# Patient Record
Sex: Male | Born: 1970
Health system: Southern US, Community
[De-identification: ages and names within clinical notes are randomized; demographics above are authoritative.]

## PROBLEM LIST (undated history)

## (undated) DIAGNOSIS — F909 Attention-deficit hyperactivity disorder, unspecified type: Secondary | ICD-10-CM

---

## 2018-03-01 ENCOUNTER — Ambulatory Visit (INDEPENDENT_AMBULATORY_CARE_PROVIDER_SITE_OTHER): Payer: Self-pay | Admitting: Pharmacist

## 2018-03-01 DIAGNOSIS — Z7252 High risk homosexual behavior: Secondary | ICD-10-CM

## 2018-03-01 NOTE — Progress Notes (Signed)
Date:  03/01/2018   HPI: Nedra HaiJeremy Demonbreun is a 47 y.o. male who presents to the [facility] clinic to discuss and initiate PrEP.  Insured   []    Uninsured  [x]    There are no active problems to display for this patient.   Patient's Medications  New Prescriptions   No medications on file  Previous Medications   LISDEXAMFETAMINE (VYVANSE) 70 MG CAPSULE    Take 70 mg by mouth daily.   MULTIPLE VITAMIN (MULTIVITAMIN) TABLET    Take 1 tablet by mouth daily.   PENTOXIFYLLINE (TRENTAL) 400 MG CR TABLET    Take 400 mg by mouth 3 (three) times daily with meals.  Modified Medications   No medications on file  Discontinued Medications   No medications on file    Allergies: Allergies  Allergen Reactions  . Penicillins Hives    As an infant, does not remember reaction specifically    Past Medical History: No past medical history on file.  Social History: Social History   Socioeconomic History  . Marital status: Single    Spouse name: Not on file  . Number of children: Not on file  . Years of education: Not on file  . Highest education level: Not on file  Occupational History  . Not on file  Social Needs  . Financial resource strain: Not on file  . Food insecurity:    Worry: Not on file    Inability: Not on file  . Transportation needs:    Medical: Not on file    Non-medical: Not on file  Tobacco Use  . Smoking status: Not on file  Substance and Sexual Activity  . Alcohol use: Not on file  . Drug use: Not on file  . Sexual activity: Not on file  Lifestyle  . Physical activity:    Days per week: Not on file    Minutes per session: Not on file  . Stress: Not on file  Relationships  . Social connections:    Talks on phone: Not on file    Gets together: Not on file    Attends religious service: Not on file    Active member of club or organization: Not on file    Attends meetings of clubs or organizations: Not on file    Relationship status: Not on file  Other Topics  Concern  . Not on file  Social History Narrative  . Not on file    [facility]  Insurance Status Uninsured  How did you hear? [facility]  Gender at birth Male  Gender identity cis-Male  Risk for HIV Condomless vaginal or anal intercourse  Sex Partners Men only  # sex partners past 3-6 mos 1-3  Sex activity preferences Insertive and receptive;Oral  Treated for STI? No  HIV symptoms? N/A  PrEP Eligibility Substantial risk for HIV    Labs:  SCr: No results found for: CREATININE HIV No results found for: HIV Hepatitis B No results found for: HEPBSAB, HEPBSAG, HEPBCAB Hepatitis C No results found for: HEPCAB, HCVRNAPCRQN Hepatitis A No results found for: HAV RPR and STI No results found for: LABRPR, RPRTITER  No flowsheet data found.  Assessment: Riki RuskJeremy is here today to initiate and discuss PrEP.  He found out about our program through [facility]. He has actually been on Truvada for over a year.  He states he tolerates it just fine with no side effects and takes it every day. He has a new prescription  from his previous doctor, but does not have any insurance now.  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  He will eventually get insurance through his school but is uninsured for now. He has ~10 tablets left of Truvada in his current bottle.  He has had 3 partners in the last 6 months. He is a versatile partner with oral sex involvement as well. Occasional condom use. He has never been diagnosed with a STD but states he was proactively treated for chlamydia but ended up having prostatitis instead. I will apply to [employer] [facility] [employer] for his Truvada and have him in our uninsured PrEP population. He does not currently have an income while in school and is supported by financial aid and student loans. I will get baseline STD testing, HIV antibody, BMET, and Hep B ab/ag today. I will see him back in 3 months since he has been taking Truvada  for ~1 year.  Plan: - 3 site swab testing, RPR, BMET, Hep B ab/ag, HIV antibody today - Truvada x 3 months if HIV negative - F/u with me 9/30 at 915am  Floria Brandau L. Tasheka Houseman, PharmD, AAHIVP, CPP Infectious Diseases Clinical Pharmacist [facility] 03/01/2018, 4:08 PM

## 2018-03-03 ENCOUNTER — Telehealth: Payer: Self-pay | Admitting: Pharmacist

## 2018-03-03 DIAGNOSIS — Z7252 High risk homosexual behavior: Secondary | ICD-10-CM

## 2018-03-03 LAB — BASIC METABOLIC PANEL
BUN: 17 mg/dL (ref 7–25)
CO2: 30 mmol/L (ref 20–32)
CREATININE: 1.16 mg/dL (ref 0.60–1.35)
Calcium: 9.6 mg/dL (ref 8.6–10.3)
Chloride: 103 mmol/L (ref 98–110)
Glucose, Bld: 92 mg/dL (ref 65–99)
Potassium: 4.5 mmol/L (ref 3.5–5.3)
Sodium: 139 mmol/L (ref 135–146)

## 2018-03-03 LAB — HEPATITIS B SURFACE ANTIGEN: Hepatitis B Surface Ag: NONREACTIVE

## 2018-03-03 LAB — CYTOLOGY, (ORAL, ANAL, URETHRAL) ANCILLARY ONLY
CHLAMYDIA, DNA PROBE: NEGATIVE
CHLAMYDIA, DNA PROBE: NEGATIVE
NEISSERIA GONORRHEA: NEGATIVE
Neisseria Gonorrhea: NEGATIVE

## 2018-03-03 LAB — HEPATITIS B SURFACE ANTIBODY,QUALITATIVE: HEP B S AB: REACTIVE — AB

## 2018-03-03 LAB — URINE CYTOLOGY ANCILLARY ONLY
Chlamydia: NEGATIVE
Neisseria Gonorrhea: NEGATIVE

## 2018-03-03 LAB — RPR: RPR Ser Ql: NONREACTIVE

## 2018-03-03 LAB — HIV ANTIBODY (ROUTINE TESTING W REFLEX): HIV: NONREACTIVE

## 2018-03-03 MED ORDER — EMTRICITABINE-TENOFOVIR DF 200-300 MG PO TABS: 1.0000 | ORAL_TABLET | Freq: Every day | ORAL | 2 refills | Status: DC

## 2018-03-03 MED FILL — TRUVADA 200-300 MG TABS: 200-300 | 30 days supply | Qty: 30 | Fill #0

## 2018-03-03 NOTE — Telephone Encounter (Signed)
Patient has been approved for uninsured PrEP through [employer] patient assistance program until 03/03/19.   Called patient to let him know that his HIV antibody was negative.  Will send in 3 months of Truvada for him and [facility]

## 2018-03-03 NOTE — Telephone Encounter (Signed)
Perfect

## 2018-04-05 MED FILL — TRUVADA 200-300 MG TABS: 200-300 | 30 days supply | Qty: 30 | Fill #1

## 2018-05-06 MED FILL — TRUVADA 200-300 MG TABS: 200-300 | 30 days supply | Qty: 30 | Fill #2

## 2018-05-31 ENCOUNTER — Ambulatory Visit (INDEPENDENT_AMBULATORY_CARE_PROVIDER_SITE_OTHER): Payer: BLUE CROSS/BLUE SHIELD | Admitting: Pharmacist

## 2018-05-31 DIAGNOSIS — Z7252 High risk homosexual behavior: Secondary | ICD-10-CM | POA: Diagnosis not present

## 2018-05-31 NOTE — Progress Notes (Signed)
Date:  05/31/2018   HPI: Glenn Sanchez is a 47 y.o. male who presents to the [facility] clinic for his 3 month PrEP follow-up visit.  Insured   [x]    Uninsured  []    There are no active problems to display for this patient.   Patient's Medications  New Prescriptions   No medications on file  Previous Medications   EMTRICITABINE-TENOFOVIR (TRUVADA) 200-300 MG TABLET    Take 1 tablet by mouth daily.   LISDEXAMFETAMINE (VYVANSE) 70 MG CAPSULE    Take 70 mg by mouth daily.   MULTIPLE VITAMIN (MULTIVITAMIN) TABLET    Take 1 tablet by mouth daily.   PENTOXIFYLLINE (TRENTAL) 400 MG CR TABLET    Take 400 mg by mouth 3 (three) times daily with meals.  Modified Medications   No medications on file  Discontinued Medications   No medications on file    Allergies: Allergies  Allergen Reactions  . Penicillins Hives    As an infant, does not remember reaction specifically    Past Medical History: No past medical history on file.  Social History: Social History   Socioeconomic History  . Marital status: Single    Spouse name: Not on file  . Number of children: Not on file  . Years of education: Not on file  . Highest education level: Not on file  Occupational History  . Not on file  Social Needs  . Financial resource strain: Not on file  . Food insecurity:    Worry: Not on file    Inability: Not on file  . Transportation needs:    Medical: Not on file    Non-medical: Not on file  Tobacco Use  . Smoking status: Not on file  Substance and Sexual Activity  . Alcohol use: Not on file  . Drug use: Not on file  . Sexual activity: Not on file  Lifestyle  . Physical activity:    Days per week: Not on file    Minutes per session: Not on file  . Stress: Not on file  Relationships  . Social connections:    Talks on phone: Not on file    Gets together: Not on file    Attends religious service: Not on file    Active member of club or organization: Not on file    Attends  meetings of clubs or organizations: Not on file    Relationship status: Not on file  Other Topics Concern  . Not on file  Social History Narrative  . Not on file    [facility] HIV PREP FLOWSHEET RESULTS 05/31/2018 03/01/2018  Insurance Status Insured Uninsured  How did you hear? Kelby Fam  Gender at birth Male Male  Gender identity cis-Male cis-Male  Risk for HIV Condomless vaginal or anal intercourse Condomless vaginal or anal intercourse  Sex Partners Men only Men only  # sex partners past 3-6 mos 1-3 1-3  Sex activity preferences Insertive;Receptive;Oral Insertive and receptive;Oral  Condom use Yes -  % condom use 100 -  Treated for STI? No No  HIV symptoms? N/A N/A  PrEP Eligibility HIV negative;CrCl >60 ml/min Substantial risk for HIV    Labs:  SCr: Lab Results  Component Value Date   CREATININE 1.16 03/01/2018   HIV Lab Results  Component Value Date   HIV NON-REACTIVE 03/01/2018   Hepatitis B Lab Results  Component Value Date   HEPBSAB REACTIVE (A) 03/01/2018   HEPBSAG NON-REACTIVE 03/01/2018   Hepatitis C No results  found for: HEPCAB, HCVRNAPCRQN Hepatitis A No results found for: HAV RPR and STI Lab Results  Component Value Date   LABRPR NON-REACTIVE 03/01/2018    STI Results GC CT  03/01/2018 Negative Negative  03/01/2018 Negative Negative  03/01/2018 Negative Negative    Assessment: Overall Glenn Sanchez is doing well and keeping busy with nursing school. He reports no side effects on Truvada and no missed doses. He reports having about 2 weeks left of Truvada and hasn't had any issues getting it from [facility]. He has insurance now through school and was a little concerned about paying for his Truvada. We reassured him that for any copay that he may have, we can use a copay card to cover it.   He reports having 2 new partners since we last saw him and uses condoms 100% of the time. We will check his HIV antibody today, but he is not due for a BMET or STD screenings. He says  that he has been telling a lot of other people about our PrEP program and we encouraged him to continue doing so.  Glenn Sanchez got his flu shot last week. When asked about his Hepatitis A immunity, he reports having titers which he had to submit for school. We will defer checking his HAV antibody since he has records of immunity.  Plan: -HIV antibody today -3 more months of Truvada if HIV negative -F/u for PrEP on 09/02/18 at 9:15AM  Arvilla Market, PharmD PGY1 Pharmacy Resident Phone [phone] 05/31/2018     10:19 AM

## 2018-06-01 ENCOUNTER — Telehealth: Payer: Self-pay | Admitting: Pharmacist

## 2018-06-01 DIAGNOSIS — Z7252 High risk homosexual behavior: Secondary | ICD-10-CM

## 2018-06-01 LAB — HIV ANTIBODY (ROUTINE TESTING W REFLEX): HIV: NONREACTIVE

## 2018-06-01 MED ORDER — EMTRICITABINE-TENOFOVIR DF 200-300 MG PO TABS: 1.0000 | ORAL_TABLET | Freq: Every day | ORAL | 2 refills | Status: DC

## 2018-06-01 NOTE — Telephone Encounter (Signed)
Called patient to let him know that his HIV antibody was negative.  Will send in 3 more months of Truvada.  His number states it is out of service, will try and get a new one for him.

## 2018-06-03 MED FILL — TRUVADA 200-300 MG TABS: 200-300 | 30 days supply | Qty: 30 | Fill #0

## 2018-07-05 MED FILL — TRUVADA 200-300 MG TABS: 200-300 | 30 days supply | Qty: 30 | Fill #1

## 2018-08-04 MED FILL — TRUVADA 200-300 MG TABS: 200-300 | 30 days supply | Qty: 30 | Fill #2

## 2018-09-02 ENCOUNTER — Ambulatory Visit (INDEPENDENT_AMBULATORY_CARE_PROVIDER_SITE_OTHER): Payer: BLUE CROSS/BLUE SHIELD | Admitting: Pharmacist

## 2018-09-02 ENCOUNTER — Other Ambulatory Visit (HOSPITAL_COMMUNITY)
Admission: RE | Admit: 2018-09-02 | Discharge: 2018-09-02 | Disposition: A | Discharge status: Home / Self Care | Payer: BLUE CROSS/BLUE SHIELD | Source: Ambulatory Visit | Attending: Infectious Disease | Admitting: Infectious Disease

## 2018-09-02 DIAGNOSIS — Z7252 High risk homosexual behavior: Secondary | ICD-10-CM | POA: Insufficient documentation

## 2018-09-02 NOTE — Progress Notes (Signed)
Date:  09/02/2018   HPI: Glenn Sanchez is a 48 y.o. male who presents to the [facility] clinic for his 3 month PrEP follow-up.   Insured   [x]    Uninsured  []    There are no active problems to display for this patient.   Patient's Medications  New Prescriptions   No medications on file  Previous Medications   EMTRICITABINE-TENOFOVIR (TRUVADA) 200-300 MG TABLET    Take 1 tablet by mouth daily.   LISDEXAMFETAMINE (VYVANSE) 70 MG CAPSULE    Take 70 mg by mouth daily.   MULTIPLE VITAMIN (MULTIVITAMIN) TABLET    Take 1 tablet by mouth daily.   PENTOXIFYLLINE (TRENTAL) 400 MG CR TABLET    Take 400 mg by mouth 3 (three) times daily with meals.  Modified Medications   No medications on file  Discontinued Medications   No medications on file    Allergies: Allergies  Allergen Reactions  . Penicillins Hives    As an infant, does not remember reaction specifically    Past Medical History: No past medical history on file.  Social History: Social History   Socioeconomic History  . Marital status: Single    Spouse name: Not on file  . Number of children: Not on file  . Years of education: Not on file  . Highest education level: Not on file  Occupational History  . Not on file  Social Needs  . Financial resource strain: Not on file  . Food insecurity:    Worry: Not on file    Inability: Not on file  . Transportation needs:    Medical: Not on file    Non-medical: Not on file  Tobacco Use  . Smoking status: Not on file  Substance and Sexual Activity  . Alcohol use: Not on file  . Drug use: Not on file  . Sexual activity: Not on file  Lifestyle  . Physical activity:    Days per week: Not on file    Minutes per session: Not on file  . Stress: Not on file  Relationships  . Social connections:    Talks on phone: Not on file    Gets together: Not on file    Attends religious service: Not on file    Active member of club or organization: Not on file    Attends meetings  of clubs or organizations: Not on file    Relationship status: Not on file  Other Topics Concern  . Not on file  Social History Narrative  . Not on file    [facility] HIV PREP FLOWSHEET RESULTS 09/02/2018 05/31/2018 03/01/2018  Insurance Status Insured Insured Uninsured  How did you hear? - - Kelby Fam  Gender at birth Male Male Male  Gender identity cis-Male cis-Male cis-Male  Risk for HIV >5 partners in past 6 mos (regardless of condom use) Condomless vaginal or anal intercourse Condomless vaginal or anal intercourse  Sex Partners Men only Men only Men only  # sex partners past 3-6 mos 1-3 1-3 1-3  Sex activity preferences Oral;Insertive and receptive Insertive;Receptive;Oral Insertive and receptive;Oral  Condom use Yes Yes -  % condom use 100 100 -  Treated for STI? No No No  HIV symptoms? N/A N/A N/A  PrEP Eligibility Substantial risk for HIV HIV negative;CrCl >60 ml/min Substantial risk for HIV    Labs:  SCr: Lab Results  Component Value Date   CREATININE 1.03 09/02/2018   CREATININE 1.16 03/01/2018   HIV Lab Results  Component  Value Date   HIV NON-REACTIVE 05/31/2018   HIV NON-REACTIVE 03/01/2018   Hepatitis B Lab Results  Component Value Date   HEPBSAB REACTIVE (A) 03/01/2018   HEPBSAG NON-REACTIVE 03/01/2018   Hepatitis C No results found for: HEPCAB, HCVRNAPCRQN Hepatitis A No results found for: HAV RPR and STI Lab Results  Component Value Date   LABRPR NON-REACTIVE 03/01/2018    STI Results GC CT  03/01/2018 Negative Negative  03/01/2018 Negative Negative  03/01/2018 Negative Negative    Assessment: Usamah is doing well on Truvada with no missed doses and no side effects.  He does not have any insurance changes with the new year and no issues with the pharmacy.  He continues in [employer] and is doing well.  He has 3-4 doses left of his Truvada.   He has had 2 partners in the last 3 months with 100% condom use. He is due for STD testing and is a versatile  partner with oral involvement as well, so will do all three site swabs today.  Will also check a HIV antibody and send in refills if negative.  I discussed switching to Descovy today and he is agreeable.  Will send in with next prescription.   Plan: - HIV antibody, urine/oral/rectal swabs, RPR, BMET today - Descovy x 3 months if negative - F/u with me 3/26 at 915am  Hiawatha Dressel L. Camyra Vaeth, PharmD, BCIDP, AAHIVP, CPP Infectious Diseases Clinical Pharmacist [facility] 09/02/2018, 4:21 PM

## 2018-09-03 ENCOUNTER — Other Ambulatory Visit: Payer: Self-pay | Admitting: *Deleted

## 2018-09-03 ENCOUNTER — Other Ambulatory Visit (HOSPITAL_COMMUNITY)
Admission: RE | Admit: 2018-09-03 | Discharge: 2018-09-03 | Disposition: A | Discharge status: Home / Self Care | Payer: BLUE CROSS/BLUE SHIELD | Source: Ambulatory Visit | Attending: Infectious Disease | Admitting: Infectious Disease

## 2018-09-03 ENCOUNTER — Other Ambulatory Visit: Payer: BLUE CROSS/BLUE SHIELD

## 2018-09-03 ENCOUNTER — Telehealth: Payer: Self-pay | Admitting: Pharmacist

## 2018-09-03 DIAGNOSIS — Z7252 High risk homosexual behavior: Secondary | ICD-10-CM

## 2018-09-03 DIAGNOSIS — Z113 Encounter for screening for infections with a predominantly sexual mode of transmission: Secondary | ICD-10-CM

## 2018-09-03 LAB — BASIC METABOLIC PANEL
BUN: 15 mg/dL (ref 7–25)
CALCIUM: 10.2 mg/dL (ref 8.6–10.3)
CO2: 28 mmol/L (ref 20–32)
Chloride: 102 mmol/L (ref 98–110)
Creat: 1.03 mg/dL (ref 0.60–1.35)
Glucose, Bld: 90 mg/dL (ref 65–99)
Potassium: 4.5 mmol/L (ref 3.5–5.3)
Sodium: 138 mmol/L (ref 135–146)

## 2018-09-03 LAB — HIV ANTIBODY (ROUTINE TESTING W REFLEX): HIV 1&2 Ab, 4th Generation: NONREACTIVE

## 2018-09-03 LAB — CYTOLOGY, (ORAL, ANAL, URETHRAL) ANCILLARY ONLY
Chlamydia: NEGATIVE
Chlamydia: NEGATIVE
Neisseria Gonorrhea: NEGATIVE
Neisseria Gonorrhea: NEGATIVE

## 2018-09-03 LAB — RPR: RPR: NONREACTIVE

## 2018-09-03 MED ORDER — EMTRICITABINE-TENOFOVIR AF 200-25 MG PO TABS: 1.0000 | ORAL_TABLET | Freq: Every day | ORAL | 2 refills | Status: DC

## 2018-09-03 MED FILL — DESCOVY 200-25 MG TABS: 200-25 | 30 days supply | Qty: 30 | Fill #0

## 2018-09-03 NOTE — Telephone Encounter (Signed)
Called patient to let him know that his HIV antibody was negative.  Will send in 3 months of Descovy.  

## 2018-09-06 LAB — URINE CYTOLOGY ANCILLARY ONLY
Chlamydia: NEGATIVE
NEISSERIA GONORRHEA: NEGATIVE

## 2018-10-04 MED FILL — DESCOVY 200-25 MG TABS: 200-25 | 30 days supply | Qty: 30 | Fill #1

## 2018-11-02 MED FILL — DESCOVY 200-25 MG TABS: 200-25 | 30 days supply | Qty: 30 | Fill #2

## 2018-11-24 ENCOUNTER — Other Ambulatory Visit: Payer: Self-pay

## 2018-11-24 ENCOUNTER — Ambulatory Visit (INDEPENDENT_AMBULATORY_CARE_PROVIDER_SITE_OTHER): Payer: BLUE CROSS/BLUE SHIELD | Admitting: Pharmacist

## 2018-11-24 DIAGNOSIS — Z7252 High risk homosexual behavior: Secondary | ICD-10-CM | POA: Diagnosis not present

## 2018-11-24 NOTE — Progress Notes (Signed)
Date:  11/24/2018   HPI: Glenn Sanchez is a 48 y.o. male who presents to the [facility] clinic for 3 month PrEP follow-up.  Insured   [x]    Uninsured  []    There are no active problems to display for this patient.   Patient's Medications  New Prescriptions   No medications on file  Previous Medications   EMTRICITABINE-TENOFOVIR AF (DESCOVY) 200-25 MG TABLET    Take 1 tablet by mouth daily.   LISDEXAMFETAMINE (VYVANSE) 70 MG CAPSULE    Take 70 mg by mouth daily.   MULTIPLE VITAMIN (MULTIVITAMIN) TABLET    Take 1 tablet by mouth daily.   PENTOXIFYLLINE (TRENTAL) 400 MG CR TABLET    Take 400 mg by mouth 3 (three) times daily with meals.  Modified Medications   No medications on file  Discontinued Medications   No medications on file    Allergies: Allergies  Allergen Reactions  . Penicillins Hives    As an infant, does not remember reaction specifically    Past Medical History: No past medical history on file.  Social History: Social History   Socioeconomic History  . Marital status: Single    Spouse name: Not on file  . Number of children: Not on file  . Years of education: Not on file  . Highest education level: Not on file  Occupational History  . Not on file  Social Needs  . Financial resource strain: Not on file  . Food insecurity:    Worry: Not on file    Inability: Not on file  . Transportation needs:    Medical: Not on file    Non-medical: Not on file  Tobacco Use  . Smoking status: Not on file  Substance and Sexual Activity  . Alcohol use: Not on file  . Drug use: Not on file  . Sexual activity: Not on file  Lifestyle  . Physical activity:    Days per week: Not on file    Minutes per session: Not on file  . Stress: Not on file  Relationships  . Social connections:    Talks on phone: Not on file    Gets together: Not on file    Attends religious service: Not on file    Active member of club or organization: Not on file    Attends meetings  of clubs or organizations: Not on file    Relationship status: Not on file  Other Topics Concern  . Not on file  Social History Narrative  . Not on file    [facility] HIV PREP FLOWSHEET RESULTS 11/24/2018 09/02/2018 05/31/2018 03/01/2018  Insurance Status Insured Insured Insured Uninsured  How did you hear? - - - Kelby Fam  Gender at birth Male Male Male Male  Gender identity cis-Male cis-Male cis-Male cis-Male  Risk for HIV - >5 partners in past 6 mos (regardless of condom use) Condomless vaginal or anal intercourse Condomless vaginal or anal intercourse  Sex Partners Men only Men only Men only Men only  # sex partners past 3-6 mos 1-3 1-3 1-3 1-3  Sex activity preferences Insertive and receptive;Oral Oral;Insertive and receptive Insertive;Receptive;Oral Insertive and receptive;Oral  Condom use Yes Yes Yes -  % condom use - 100 100 -  Treated for STI? No No No No  HIV symptoms? N/A N/A N/A N/A  PrEP Eligibility Substantial risk for HIV Substantial risk for HIV HIV negative;CrCl >60 ml/min Substantial risk for HIV    Labs:  SCr: Lab Results  Component  Value Date   CREATININE 1.03 09/02/2018   CREATININE 1.16 03/01/2018   HIV Lab Results  Component Value Date   HIV NON-REACTIVE 09/02/2018   HIV NON-REACTIVE 05/31/2018   HIV NON-REACTIVE 03/01/2018   Hepatitis B Lab Results  Component Value Date   HEPBSAB REACTIVE (A) 03/01/2018   HEPBSAG NON-REACTIVE 03/01/2018   Hepatitis C No results found for: HEPCAB, HCVRNAPCRQN Hepatitis A No results found for: HAV RPR and STI Lab Results  Component Value Date   LABRPR NON-REACTIVE 09/02/2018   LABRPR NON-REACTIVE 03/01/2018    STI Results GC CT  09/03/2018 Negative Negative  09/02/2018 Negative Negative  09/02/2018 Negative Negative  03/01/2018 Negative Negative  03/01/2018 Negative Negative  03/01/2018 Negative Negative    Assessment: Glenn Sanchez is here today to follow-up for PrEP.  He was switched to Descovy at his last visit and is doing  well with the switch.  No side effects or missed doses.  No problems with his insurance or [employer]. He has no new partners and remains with the same 2 as before.  We checked kidney function and STDs at last visit so will defer today. Will see him back in 3 months.  Plan: - HIV antibody today - Descovy x 3 months if HIV negative - F/u with me 6/24 at 915am   L. , PharmD, BCIDP, AAHIVP, CPP Infectious Diseases Clinical Pharmacist [facility] 11/24/2018, 3:23 PM

## 2018-11-25 ENCOUNTER — Telehealth: Payer: Self-pay | Admitting: Pharmacist

## 2018-11-25 ENCOUNTER — Ambulatory Visit: Payer: BLUE CROSS/BLUE SHIELD

## 2018-11-25 DIAGNOSIS — Z7252 High risk homosexual behavior: Secondary | ICD-10-CM

## 2018-11-25 LAB — HIV ANTIBODY (ROUTINE TESTING W REFLEX): HIV 1&2 Ab, 4th Generation: NONREACTIVE

## 2018-11-25 MED ORDER — EMTRICITABINE-TENOFOVIR AF 200-25 MG PO TABS: 1.0000 | ORAL_TABLET | Freq: Every day | ORAL | 2 refills | Status: DC

## 2018-11-25 MED FILL — DESCOVY 200-25 MG TABS: 200-25 | 30 days supply | Qty: 30 | Fill #0

## 2018-11-25 NOTE — Telephone Encounter (Signed)
Called patient to let him know that his HIV antibody was negative.  Will send in 3 more months of Descovy and [facility] will mail to his house.

## 2018-12-20 ENCOUNTER — Emergency Department (HOSPITAL_COMMUNITY): Payer: BLUE CROSS/BLUE SHIELD

## 2018-12-20 ENCOUNTER — Emergency Department (HOSPITAL_COMMUNITY)
Admission: EM | Admit: 2018-12-20 | Discharge: 2018-12-20 | Disposition: A | Discharge status: Home / Self Care | Payer: BLUE CROSS/BLUE SHIELD | Attending: Emergency Medicine | Admitting: Emergency Medicine

## 2018-12-20 ENCOUNTER — Other Ambulatory Visit: Payer: Self-pay

## 2018-12-20 ENCOUNTER — Encounter (HOSPITAL_COMMUNITY): Payer: Self-pay | Admitting: Emergency Medicine

## 2018-12-20 DIAGNOSIS — N50812 Left testicular pain: Secondary | ICD-10-CM | POA: Diagnosis present

## 2018-12-20 DIAGNOSIS — Z79899 Other long term (current) drug therapy: Secondary | ICD-10-CM | POA: Diagnosis not present

## 2018-12-20 DIAGNOSIS — F17210 Nicotine dependence, cigarettes, uncomplicated: Secondary | ICD-10-CM | POA: Diagnosis not present

## 2018-12-20 DIAGNOSIS — N451 Epididymitis: Secondary | ICD-10-CM | POA: Insufficient documentation

## 2018-12-20 HISTORY — DX: Attention-deficit hyperactivity disorder, unspecified type: F90.9

## 2018-12-20 MED ORDER — OXYCODONE HCL 5 MG PO TABS
10.0000 mg | ORAL_TABLET | Freq: Once | ORAL | Status: AC
  Administered 2018-12-20: 10 mg via ORAL
  Filled 2018-12-20: qty 2

## 2018-12-20 MED ORDER — LEVOFLOXACIN 500 MG PO TABS: 500.0000 mg | ORAL_TABLET | Freq: Every day | ORAL | 0 refills | Status: AC

## 2018-12-20 MED ORDER — CEFTRIAXONE SODIUM 250 MG IJ SOLR
250.0000 mg | Freq: Once | INTRAMUSCULAR | Status: AC
  Administered 2018-12-20: 13:00:00 250 mg via INTRAMUSCULAR
  Filled 2018-12-20: qty 250

## 2018-12-20 MED ORDER — ACETAMINOPHEN 500 MG PO TABS
1000.0000 mg | ORAL_TABLET | Freq: Once | ORAL | Status: AC
  Administered 2018-12-20: 11:00:00 1000 mg via ORAL
  Filled 2018-12-20: qty 2

## 2018-12-20 MED ORDER — KETOROLAC TROMETHAMINE 60 MG/2ML IM SOLN
15.0000 mg | Freq: Once | INTRAMUSCULAR | Status: DC
  Filled 2018-12-20: qty 2

## 2018-12-20 MED FILL — levoFLOXacin 500 MG TABS: 500 | 10 days supply | Qty: 10 | Fill #0

## 2018-12-20 NOTE — ED Provider Notes (Addendum)
[facility]-EMERGENCY DEPT Provider Note   CSN: [id number] Arrival date & time: 12/20/18  1034    History   Chief Complaint Chief Complaint  Patient presents with   Testicle Pain    HPI Glenn Sanchez is a 48 y.o. male.     48 yo M with a cc of left testicle pain.  Sharp, stabbing, throbbing.  Slowly worsening since yesterday.  Denies trauma, denies dysuria. Denies abdominal pain. Worse with walking, palpation.    The history is provided by the patient.  Testicle Pain  This is a new problem. The current episode started yesterday. The problem occurs constantly. The problem has been gradually worsening. Pertinent negatives include no chest pain, no abdominal pain, no headaches and no shortness of breath. Nothing aggravates the symptoms. Nothing relieves the symptoms. He has tried nothing for the symptoms. The treatment provided no relief.    Past Medical History:  Diagnosis Date   ADHD     There are no active problems to display for this patient.   History reviewed. No pertinent surgical history.      Home Medications    Prior to Admission medications   Medication Sig Start Date End Date Taking? Authorizing Provider  emtricitabine-tenofovir AF (DESCOVY) 200-25 MG tablet Take 1 tablet by mouth daily. 11/25/18  Yes Kuppelweiser, Cassie L, RPH-CPP  lisdexamfetamine (VYVANSE) 70 MG capsule Take 70 mg by mouth daily.   Yes [provider]  Multiple Vitamin (MULTIVITAMIN) tablet Take 1 tablet by mouth daily.   Yes [provider]  Oil of Oregano 1500 MG CAPS Take 1,500 mg by mouth 2 (two) times a week.   Yes [provider]  pentoxifylline (TRENTAL) 400 MG CR tablet Take 400 mg by mouth 3 (three) times daily with meals.   Yes [provider]  levofloxacin (LEVAQUIN) 500 MG tablet Take 1 tablet (500 mg total) by mouth daily. 12/20/18   Melene Plan, DO    Family History No family history on file.  Social History Social  History   Tobacco Use   Smoking status: Current Every Day Smoker    Types: Cigarettes   Smokeless tobacco: Never Used  Substance Use Topics   Alcohol use: Not on file   Drug use: Not on file     Allergies   Penicillins   Review of Systems Review of Systems  Constitutional: Negative for chills and fever.  HENT: Negative for congestion and facial swelling.   Eyes: Negative for discharge and visual disturbance.  Respiratory: Negative for shortness of breath.   Cardiovascular: Negative for chest pain and palpitations.  Gastrointestinal: Negative for abdominal pain, diarrhea and vomiting.  Genitourinary: Positive for testicular pain. Negative for discharge, dysuria and penile pain.  Musculoskeletal: Negative for arthralgias and myalgias.  Skin: Negative for color change and rash.  Neurological: Negative for tremors, syncope and headaches.  Psychiatric/Behavioral: Negative for confusion and dysphoric mood.     Physical Exam Updated Vital Signs BP 114/78 (BP Location: Left Arm)    Pulse 88    Temp 98.1 F (36.7 C) (Oral)    Resp 17    Ht  (1.803 m)    Wt 77.1 kg    SpO2 99%    BMI 23.71 kg/m   Physical Exam Vitals signs and nursing note reviewed.  Constitutional:      Appearance: He is well-developed.  HENT:     Head: Normocephalic and atraumatic.  Eyes:     Pupils: Pupils are equal,  round, and reactive to light.  Neck:     Musculoskeletal: Normal range of motion and neck supple.     Vascular: No JVD.  Cardiovascular:     Rate and Rhythm: Normal rate and regular rhythm.     Heart sounds: No murmur. No friction rub. No gallop.   Pulmonary:     Effort: No respiratory distress.     Breath sounds: No wheezing.  Abdominal:     General: There is no distension.     Tenderness: There is no guarding or rebound.  Genitourinary:   Musculoskeletal: Normal range of motion.  Skin:    Coloration: Skin is not pale.     Findings: No rash.  Neurological:     Mental  Status: He is alert and oriented to person, place, and time.  Psychiatric:        Behavior: Behavior normal.      ED Treatments / Results  Labs (all labs ordered are listed, but only abnormal results are displayed) Labs Reviewed - No data to display  EKG None  Radiology Koreas Scrotum W/doppler  Result Date: 12/20/2018 CLINICAL DATA:  Left-sided scrotal region pain and swelling EXAM: SCROTAL ULTRASOUND DOPPLER ULTRASOUND OF THE TESTICLES TECHNIQUE: Complete ultrasound examination of the testicles, epididymis, and other scrotal structures was performed. Color and spectral Doppler ultrasound were also utilized to evaluate blood flow to the testicles. COMPARISON:  None. FINDINGS: Right testicle Measurements: 4.4 x 3.1 x 3.2 cm. No mass or microlithiasis visualized. Left testicle Measurements: 4.5 x 2.7 x 3.4 cm. No mass or microlithiasis visualized. Right epididymis: There is a cyst arising from the head of the epididymis on the right measuring 1.2 x 1.4 x 1.7 cm. No other mass in the right epididymal region. No inflammatory focus appreciable on the right. Left epididymis: Several cysts arise from the epididymis on the left. Largest of these cysts measures 1.5 x 1.5 x 2.1 cm. Other cysts are subcentimeter in size. The largest of the subcentimeter cysts measures 8 x 7 mm. No noncystic masses evident. No inflammatory focus. Hydrocele: There is a small hydrocele on each side, slightly larger on the right than on the left. Varicocele:  None visualized. Pulsed Doppler interrogation of both testes demonstrates normal low resistance arterial and venous waveforms bilaterally. No scrotal wall thickening or abscess. IMPRESSION: 1. Epididymal cysts bilaterally, larger and more numerous on the left than on the right. No inflammatory focus in either epididymis. 2. No intratesticular mass or inflammation. No evident testicular torsion on either side. 3. Small bilateral hydroceles, slightly larger on the right than on  the left. Electronically Signed   By: Bretta BangWilliam  Woodruff III M.D.   On: 12/20/2018 11:40    Procedures Procedures (including critical care time)  Medications Ordered in ED Medications  ketorolac (TORADOL) injection 15 mg (has no administration in time range)  cefTRIAXone (ROCEPHIN) injection 250 mg (has no administration in time range)  acetaminophen (TYLENOL) tablet 1,000 mg (1,000 mg Oral Given 12/20/18 1059)  oxyCODONE (Oxy IR/ROXICODONE) immediate release tablet 10 mg (10 mg Oral Given 12/20/18 1059)     Initial Impression / Assessment and Plan / ED Course  I have reviewed the triage vital signs and the nursing notes.  Pertinent labs & imaging results that were available during my care of the patient were reviewed by me and considered in my medical decision making (see chart for details).        48 yo M with a cc of left testicle pain. Going  on since yesterday.  Will Korea. Korea without obvious pathology for symptoms.  Clinically most likely is epididymoorchitis.  Will treat with abx.  Patient has sex with other men, will start on levaquin.  Given dose of IM rocephin.  Urology follow up.   11:56 AM:  I have discussed the diagnosis/risks/treatment options with the patient and believe the pt to be eligible for discharge home to follow-up with PCP, urology. We also discussed returning to the ED immediately if new or worsening sx occur. We discussed the sx which are most concerning (e.g., sudden worsening pain, fever, inability to tolerate by mouth) that necessitate immediate return. Medications administered to the patient during their visit and any new prescriptions provided to the patient are listed below.  Medications given during this visit Medications  ketorolac (TORADOL) injection 15 mg (has no administration in time range)  cefTRIAXone (ROCEPHIN) injection 250 mg (has no administration in time range)  acetaminophen (TYLENOL) tablet 1,000 mg (1,000 mg Oral Given 12/20/18 1059)    oxyCODONE (Oxy IR/ROXICODONE) immediate release tablet 10 mg (10 mg Oral Given 12/20/18 1059)     The patient appears reasonably screen and/or stabilized for discharge and I doubt any other medical condition or other [facility] requiring further screening, evaluation, or treatment in the ED at this time prior to discharge.    Final Clinical Impressions(s) / ED Diagnoses   Final diagnoses:  Epididymitis    ED Discharge Orders         Ordered    levofloxacin (LEVAQUIN) 500 MG tablet  Daily     12/20/18 1152           Melene Plan, DO 12/20/18 1156    Melene Plan, DO 12/20/18 1157

## 2018-12-20 NOTE — Discharge Instructions (Signed)
Wear tight fitting underwear.  Take the antibiotics as prescribed.  Be careful with heavy lifting or sprinting for the next couple of weeks. Please return for fever or sudden worsening pain.   Take 4 over the counter ibuprofen tablets 3 times a day or 2 over-the-counter naproxen tablets twice a day for pain. Also take tylenol 1000mg (2 extra strength) four times a day.

## 2018-12-20 NOTE — ED Triage Notes (Signed)
Pt reports having slow onset in left testicle pain with swelling that started yesterday. Reports took warm bath and seemed to relieve the pain through the night. Pt denies blood in urine.

## 2019-01-03 MED FILL — DESCOVY 200-25 MG TABS: 200-25 | 30 days supply | Qty: 30 | Fill #1

## 2019-02-01 MED FILL — DESCOVY 200-25 MG TABS: 200-25 | 30 days supply | Qty: 30 | Fill #2

## 2019-02-22 ENCOUNTER — Telehealth: Payer: Self-pay | Admitting: Pharmacist

## 2019-02-22 NOTE — Telephone Encounter (Signed)
COVID-19 Pre-Screening Questions: ° °Do you currently have a fever (>100 °F), chills or unexplained body aches? No  ° °Are you currently experiencing new cough, shortness of breath, sore throat, runny nose? No  °•  °Have you recently travelled outside the state of [place] in the last 14 days? No  °•  °Have you been in contact with someone that is currently pending confirmation of Covid19 testing or has been confirmed to have the Covid19 virus?  No  °

## 2019-02-23 ENCOUNTER — Other Ambulatory Visit: Payer: Self-pay

## 2019-02-23 ENCOUNTER — Ambulatory Visit (INDEPENDENT_AMBULATORY_CARE_PROVIDER_SITE_OTHER): Payer: Self-pay | Admitting: Pharmacist

## 2019-02-23 DIAGNOSIS — Z7252 High risk homosexual behavior: Secondary | ICD-10-CM

## 2019-02-23 NOTE — Progress Notes (Addendum)
Date:  02/23/2019   HPI: Glenn Sanchez is a 48 y.o. male who presents to [facility] for 3 month PrEP follow-up.  Insured   [x]    Uninsured  []    There are no active problems to display for this patient.   Patient's Medications  New Prescriptions   No medications on file  Previous Medications   EMTRICITABINE-TENOFOVIR AF (DESCOVY) 200-25 MG TABLET    Take 1 tablet by mouth daily.   LEVOFLOXACIN (LEVAQUIN) 500 MG TABLET    Take 1 tablet (500 mg total) by mouth daily.   LISDEXAMFETAMINE (VYVANSE) 70 MG CAPSULE    Take 70 mg by mouth daily.   MULTIPLE VITAMIN (MULTIVITAMIN) TABLET    Take 1 tablet by mouth daily.   OIL OF OREGANO 1500 MG CAPS    Take 1,500 mg by mouth 2 (two) times a week.   PENTOXIFYLLINE (TRENTAL) 400 MG CR TABLET    Take 400 mg by mouth 3 (three) times daily with meals.  Modified Medications   No medications on file  Discontinued Medications   No medications on file    Allergies: Allergies  Allergen Reactions  . Penicillins Hives    As an infant, does not remember reaction specifically Did it involve swelling of the face/tongue/throat, SOB, or low BP? Unknown Did it involve sudden or severe rash/hives, skin peeling, or any reaction on the inside of your mouth or nose? Yes Did you need to seek medical attention at a hospital or doctor's office? Unknown When did it last happen?Childhood rxn If all above answers are "NO", may proceed with cephalosporin use.  [name] Antibiotics Hives    Past Medical History: Past Medical History:  Diagnosis Date  . ADHD     Social History: Social History   Socioeconomic History  . Marital status: Single    Spouse name: Not on file  . Number of children: Not on file  . Years of education: Not on file  . Highest education level: Not on file  Occupational History  . Not on file  Social Needs  . Financial resource strain: Not on file  . Food insecurity    Worry: Not on file    Inability: Not on  file  . Transportation needs    Medical: Not on file    Non-medical: Not on file  Tobacco Use  . Smoking status: Current Every Day Smoker    Types: Cigarettes  . Smokeless tobacco: Never Used  Substance and Sexual Activity  . Alcohol use: Not on file  . Drug use: Not on file  . Sexual activity: Not on file  Lifestyle  . Physical activity    Days per week: Not on file    Minutes per session: Not on file  . Stress: Not on file  Relationships  . Social Herbalist on phone: Not on file    Gets together: Not on file    Attends religious service: Not on file    Active member of club or organization: Not on file    Attends meetings of clubs or organizations: Not on file    Relationship status: Not on file  Other Topics Concern  . Not on file  Social History Narrative  . Not on file    [facility] HIV PREP FLOWSHEET RESULTS 02/23/2019 11/24/2018 09/02/2018 05/31/2018 03/01/2018  Insurance Status Insured Insured Insured Insured Uninsured  How did you hear? - - - - Audelia Hives  Gender at birth Male  Male Male Male Male  Gender identity cis-Male cis-Male cis-Male cis-Male cis-Male  Risk for HIV - - >5 partners in past 6 mos (regardless of condom use) Condomless vaginal or anal intercourse Condomless vaginal or anal intercourse  Sex Partners Men only Men only Men only Men only Men only  # sex partners past 3-6 mos 1-3 1-3 1-3 1-3 1-3  Sex activity preferences - Insertive and receptive;Oral Oral;Insertive and receptive Insertive;Receptive;Oral Insertive and receptive;Oral  Condom use - Yes Yes Yes -  % condom use - - 100 100 -  Treated for STI? - No No No No  HIV symptoms? N/A N/A N/A N/A N/A  PrEP Eligibility - Substantial risk for HIV Substantial risk for HIV HIV negative;CrCl >60 ml/min Substantial risk for HIV    Labs:  SCr: Lab Results  Component Value Date   CREATININE 1.03 09/02/2018   CREATININE 1.16 03/01/2018   HIV Lab Results  Component Value Date   HIV NON-REACTIVE  11/24/2018   HIV NON-REACTIVE 09/02/2018   HIV NON-REACTIVE 05/31/2018   HIV NON-REACTIVE 03/01/2018   Hepatitis B Lab Results  Component Value Date   HEPBSAB REACTIVE (A) 03/01/2018   HEPBSAG NON-REACTIVE 03/01/2018   Hepatitis C No results found for: HEPCAB, HCVRNAPCRQN Hepatitis A No results found for: HAV RPR and STI Lab Results  Component Value Date   LABRPR NON-REACTIVE 09/02/2018   LABRPR NON-REACTIVE 03/01/2018    STI Results GC CT  09/03/2018 Negative Negative  09/02/2018 Negative Negative  09/02/2018 Negative Negative  03/01/2018 Negative Negative  03/01/2018 Negative Negative  03/01/2018 Negative Negative    Assessment: Glenn Sanchez is here today for his 3 month PrEP follow-up and lab appointment.  He is doing well on Descovy and taking it every day.  No issues or side effects.  He has recently gotten back together with his ex-boyfriend who lives in [place], [place].  He has not had any partners since I saw him back in March and is going to see his partner in [place] next week.    He has a history of chronic prostatitis and had an episode of epididymitis back in April where he went to the ED.  They gave him a Rx for Levaquin x 10 days which helped but he had recurrent pain and swelling soon after.  He called his PCP and they gave him 45 days of Bactrim, but he developed a rash and hives 2-3 days after starting. I updated his allergy list to include sulfa now.  He was then given a Rx for Cipro x 45 days.  He is taking that now and states it is better. Will just check a HIV antibody today as he has had no partners.  Plan: - HIV antibody - Descovy x 3 months if HIV negative - F/u with me again 9/23 at 10am  Joselle Deeds L. Kaliana Albino, PharmD, BCIDP, AAHIVP, CPP Infectious Diseases Clinical Pharmacist [facility] 02/23/2019, 3:06 PM

## 2019-02-24 LAB — HIV ANTIBODY (ROUTINE TESTING W REFLEX): HIV 1&2 Ab, 4th Generation: NONREACTIVE

## 2019-02-24 LAB — BASIC METABOLIC PANEL
BUN: 19 mg/dL (ref 7–25)
CO2: 26 mmol/L (ref 20–32)
Calcium: 10 mg/dL (ref 8.6–10.3)
Chloride: 100 mmol/L (ref 98–110)
Creat: 1.13 mg/dL (ref 0.60–1.35)
Glucose, Bld: 94 mg/dL (ref 65–99)
Potassium: 4.5 mmol/L (ref 3.5–5.3)
Sodium: 140 mmol/L (ref 135–146)

## 2019-02-25 ENCOUNTER — Telehealth: Payer: Self-pay | Admitting: Pharmacist

## 2019-02-25 DIAGNOSIS — Z7252 High risk homosexual behavior: Secondary | ICD-10-CM

## 2019-02-25 MED ORDER — DESCOVY 200-25 MG PO TABS: 1.0000 | ORAL_TABLET | Freq: Every day | ORAL | 2 refills | Status: DC

## 2019-02-25 NOTE — Telephone Encounter (Signed)
Patient's HIV antibody is negative. Will send in 3 more months of Descovy to [facility] and he will pick up sometime next week.

## 2019-02-28 MED FILL — DESCOVY 200-25 MG TABS: 200-25 | 30 days supply | Qty: 30 | Fill #0

## 2019-03-28 MED FILL — DESCOVY 200-25 MG TABS: 200-25 | 30 days supply | Qty: 30 | Fill #1

## 2019-04-11 ENCOUNTER — Telehealth: Payer: Self-pay | Admitting: Pharmacy Technician

## 2019-04-11 MED FILL — DESCOVY 200-25 MG TABS: 200-25 | 30 days supply | Qty: 30 | Fill #1

## 2019-04-11 NOTE — Telephone Encounter (Signed)
[facility] Patient Advocate Encounter   Patient has been approved for [employer] Advancing Access Patient Assistance Program for [place] for 30-day coverage. This assistance will make the patient's copay $0.  I have spoken with the patient and they will pick up at [facility] today.  The billing information is as follows  Member ID: [id number] [place]: [id number] PCN: [id number] Group: [id number]  Patient knows to call the office with questions or concerns.  Bartholomew Crews, CPhT Specialty Pharmacy Patient [facility] Phone: [phone] Fax: [phone] 04/11/2019 10:55 AM

## 2019-05-11 MED FILL — DESCOVY 200-25 MG TABS: 200-25 | 30 days supply | Qty: 30 | Fill #2

## 2019-05-25 ENCOUNTER — Other Ambulatory Visit (HOSPITAL_COMMUNITY)
Admission: RE | Admit: 2019-05-25 | Discharge: 2019-05-25 | Disposition: A | Discharge status: Home / Self Care | Payer: BC Managed Care – PPO | Source: Ambulatory Visit | Attending: Infectious Disease | Admitting: Infectious Disease

## 2019-05-25 ENCOUNTER — Other Ambulatory Visit: Payer: Self-pay | Admitting: Infectious Disease

## 2019-05-25 ENCOUNTER — Ambulatory Visit (INDEPENDENT_AMBULATORY_CARE_PROVIDER_SITE_OTHER): Payer: BC Managed Care – PPO | Admitting: Pharmacist

## 2019-05-25 ENCOUNTER — Other Ambulatory Visit: Payer: Self-pay

## 2019-05-25 DIAGNOSIS — Z7252 High risk homosexual behavior: Secondary | ICD-10-CM

## 2019-05-25 NOTE — Progress Notes (Signed)
Date:  05/25/2019   HPI: Glenn Sanchez is a 48 y.o. male who presents to the [facility] clinic for 3 month PrEP follow-up.  Insured   [x]    Uninsured  []    There are no active problems to display for this patient.   Patient's Medications  New Prescriptions   No medications on file  Previous Medications   EMTRICITABINE-TENOFOVIR AF (DESCOVY) 200-25 MG TABLET    Take 1 tablet by mouth daily.   LEVOFLOXACIN (LEVAQUIN) 500 MG TABLET    Take 1 tablet (500 mg total) by mouth daily.   LISDEXAMFETAMINE (VYVANSE) 70 MG CAPSULE    Take 70 mg by mouth daily.   MULTIPLE VITAMIN (MULTIVITAMIN) TABLET    Take 1 tablet by mouth daily.   OIL OF OREGANO 1500 MG CAPS    Take 1,500 mg by mouth 2 (two) times a week.   PENTOXIFYLLINE (TRENTAL) 400 MG CR TABLET    Take 400 mg by mouth 3 (three) times daily with meals.  Modified Medications   No medications on file  Discontinued Medications   No medications on file    Allergies: Allergies  Allergen Reactions   Penicillins Hives    As an infant, does not remember reaction specifically Did it involve swelling of the face/tongue/throat, SOB, or low BP? Unknown Did it involve sudden or severe rash/hives, skin peeling, or any reaction on the inside of your mouth or nose? Yes Did you need to seek medical attention at a hospital or doctor's office? Unknown When did it last happen?Childhood rxn If all above answers are NO, may proceed with cephalosporin use.   Sulfa Antibiotics Hives    Past Medical History: Past Medical History:  Diagnosis Date   ADHD     Social History: Social History   Socioeconomic History   Marital status: Single    Spouse name: Not on file   Number of children: Not on file   Years of education: Not on file   Highest education level: Not on file  Occupational History   Not on file  Social Needs   Financial resource strain: Not on file   Food insecurity    Worry: Not on file    Inability: Not on  file   Transportation needs    Medical: Not on file    Non-medical: Not on file  Tobacco Use   Smoking status: Current Every Day Smoker    Types: Cigarettes   Smokeless tobacco: Never Used  Substance and Sexual Activity   Alcohol use: Not on file   Drug use: Not on file   Sexual activity: Not on file  Lifestyle   Physical activity    Days per week: Not on file    Minutes per session: Not on file   Stress: Not on file  Relationships   Social connections    Talks on phone: Not on file    Gets together: Not on file    Attends religious service: Not on file    Active member of club or organization: Not on file    Attends meetings of clubs or organizations: Not on file    Relationship status: Not on file  Other Topics Concern   Not on file  Social History Narrative   Not on file    [facility] HIV PREP FLOWSHEET RESULTS 05/25/2019 02/23/2019 11/24/2018 09/02/2018 05/31/2018 03/01/2018  Insurance Status Insured Insured Insured Insured Insured Uninsured  How did you hear? - - - - 06/02/2018  Gender  at birth Male Male Male Male Male Male  Gender identity cis-Male cis-Male cis-Male cis-Male cis-Male cis-Male  Risk for HIV >5 partners in past 6 mos (regardless of condom use) - - >5 partners in past 6 mos (regardless of condom use) Condomless vaginal or anal intercourse Condomless vaginal or anal intercourse  Sex Partners Men only Men only Men only Men only Men only Men only  # sex partners past 3-6 mos 1-3 1-3 1-3 1-3 1-3 1-3  Sex activity preferences Insertive and receptive;Oral - Insertive and receptive;Oral Oral;Insertive and receptive Insertive;Receptive;Oral Insertive and receptive;Oral  Condom use Yes - Yes Yes Yes -  % condom use 100 - - 100 100 -  Treated for STI? No - No No No No  HIV symptoms? N/A N/A N/A N/A N/A N/A  PrEP Eligibility Substantial risk for HIV - Substantial risk for HIV Substantial risk for HIV HIV negative;CrCl >60 ml/min Substantial risk for HIV     Labs:  SCr: Lab Results  Component Value Date   CREATININE 1.13 02/23/2019   CREATININE 1.03 09/02/2018   CREATININE 1.16 03/01/2018   HIV Lab Results  Component Value Date   HIV NON-REACTIVE 02/23/2019   HIV NON-REACTIVE 11/24/2018   HIV NON-REACTIVE 09/02/2018   HIV NON-REACTIVE 05/31/2018   HIV NON-REACTIVE 03/01/2018   Hepatitis B Lab Results  Component Value Date   HEPBSAB REACTIVE (A) 03/01/2018   HEPBSAG NON-REACTIVE 03/01/2018   Hepatitis C No results found for: HEPCAB, HCVRNAPCRQN Hepatitis A No results found for: HAV RPR and STI Lab Results  Component Value Date   LABRPR NON-REACTIVE 09/02/2018   LABRPR NON-REACTIVE 03/01/2018    STI Results GC CT  09/03/2018 Negative Negative  09/02/2018 Negative Negative  09/02/2018 Negative Negative  03/01/2018 Negative Negative  03/01/2018 Negative Negative  03/01/2018 Negative Negative    Assessment: Glenn Sanchez is here today for his 3 month PrEP follow-up and lab appointment.  He is doing well on Descovy and having no side effects or missed doses.  He is no longer with his boyfriend anymore that lives in [place].  He has had 2 partners since I saw him last with 100% condom use. 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 He doesn't have any issues or concerns today.  His insurance did lapse for a few weeks between school years, but it is working now with no issues. Will screen him for STDs today and check HIV. Will see him back in 3 months.  He has already had his flu shot at the internal medicine clinic.  Plan: - HIV antibody, RPR, urine/oral/rectal gonorrhea/chlamydia cytology today - Descovy x 3 months if HIV negative - F/u with me again 12/23 at 915am  Bertha Lokken L. Taleshia Luff, PharmD, BCIDP, AAHIVP, [place] 05/25/2019, 4:01 PM

## 2019-05-26 ENCOUNTER — Other Ambulatory Visit: Payer: Self-pay | Admitting: Pharmacist

## 2019-05-26 DIAGNOSIS — Z7252 High risk homosexual behavior: Secondary | ICD-10-CM

## 2019-05-26 LAB — HIV ANTIBODY (ROUTINE TESTING W REFLEX): HIV 1&2 Ab, 4th Generation: NONREACTIVE

## 2019-05-26 LAB — RPR: RPR Ser Ql: NONREACTIVE

## 2019-05-26 MED ORDER — DESCOVY 200-25 MG PO TABS: 1.0000 | ORAL_TABLET | Freq: Every day | ORAL | 2 refills | Status: DC

## 2019-05-26 NOTE — Progress Notes (Signed)
Patient's HIV antibody is negative.  Will send in 3 more months of Descovy to [facility].  

## 2019-05-27 LAB — CYTOLOGY, (ORAL, ANAL, URETHRAL) ANCILLARY ONLY
Chlamydia: NEGATIVE
Chlamydia: NEGATIVE
Neisseria Gonorrhea: NEGATIVE
Neisseria Gonorrhea: NEGATIVE

## 2019-05-27 LAB — URINE CYTOLOGY ANCILLARY ONLY
Chlamydia: NEGATIVE
Neisseria Gonorrhea: NEGATIVE

## 2019-06-09 MED FILL — DESCOVY 200-25 MG TABS: 200-25 | 30 days supply | Qty: 30 | Fill #0

## 2019-07-07 MED FILL — DESCOVY 200-25 MG TABS: 200-25 | 30 days supply | Qty: 30 | Fill #1

## 2019-07-18 ENCOUNTER — Encounter: Payer: Self-pay | Admitting: Pharmacist

## 2019-07-18 ENCOUNTER — Other Ambulatory Visit: Payer: Self-pay | Admitting: Pharmacist

## 2019-07-18 DIAGNOSIS — Z7252 High risk homosexual behavior: Secondary | ICD-10-CM

## 2019-08-08 MED FILL — DESCOVY 200-25 MG TABS: 200-25 | 30 days supply | Qty: 30 | Fill #2

## 2019-08-24 ENCOUNTER — Other Ambulatory Visit: Payer: Self-pay

## 2019-08-24 ENCOUNTER — Other Ambulatory Visit: Payer: BC Managed Care – PPO

## 2019-08-24 ENCOUNTER — Ambulatory Visit: Payer: BC Managed Care – PPO | Admitting: Pharmacist

## 2019-08-24 DIAGNOSIS — Z7252 High risk homosexual behavior: Secondary | ICD-10-CM

## 2019-08-25 LAB — BASIC METABOLIC PANEL
BUN: 14 mg/dL (ref 7–25)
CO2: 27 mmol/L (ref 20–32)
Calcium: 9.4 mg/dL (ref 8.6–10.3)
Chloride: 103 mmol/L (ref 98–110)
Creat: 0.93 mg/dL (ref 0.60–1.35)
Glucose, Bld: 119 mg/dL — ABNORMAL HIGH (ref 65–99)
Potassium: 4.4 mmol/L (ref 3.5–5.3)
Sodium: 140 mmol/L (ref 135–146)

## 2019-08-25 LAB — HIV ANTIBODY (ROUTINE TESTING W REFLEX): HIV 1&2 Ab, 4th Generation: NONREACTIVE

## 2019-08-29 ENCOUNTER — Encounter: Payer: Self-pay | Admitting: Pharmacist

## 2019-08-29 ENCOUNTER — Other Ambulatory Visit: Payer: Self-pay | Admitting: Pharmacist

## 2019-08-29 DIAGNOSIS — Z7252 High risk homosexual behavior: Secondary | ICD-10-CM

## 2019-08-29 MED ORDER — DESCOVY 200-25 MG PO TABS: 1.0000 | ORAL_TABLET | Freq: Every day | ORAL | 2 refills | Status: DC

## 2019-08-29 NOTE — Progress Notes (Signed)
Patient's HIV antibody is negative.  Will send in 3 more months of Descovy to [facility].  

## 2019-08-31 MED FILL — DESCOVY 200-25 MG TABS: 200-25 | 30 days supply | Qty: 30 | Fill #0

## 2019-10-07 MED FILL — DESCOVY 200-25 MG TABS: 200-25 | 30 days supply | Qty: 30 | Fill #1

## 2019-11-07 MED FILL — DESCOVY 200-25 MG TABS: 200-25 | 30 days supply | Qty: 30 | Fill #2

## 2019-11-23 ENCOUNTER — Other Ambulatory Visit: Payer: Self-pay

## 2019-11-23 ENCOUNTER — Ambulatory Visit (INDEPENDENT_AMBULATORY_CARE_PROVIDER_SITE_OTHER): Payer: BC Managed Care – PPO | Admitting: Pharmacist

## 2019-11-23 ENCOUNTER — Other Ambulatory Visit (HOSPITAL_COMMUNITY)
Admission: RE | Admit: 2019-11-23 | Discharge: 2019-11-23 | Disposition: A | Discharge status: Home / Self Care | Payer: BC Managed Care – PPO | Source: Ambulatory Visit | Attending: Infectious Disease | Admitting: Infectious Disease

## 2019-11-23 DIAGNOSIS — Z79899 Other long term (current) drug therapy: Secondary | ICD-10-CM

## 2019-11-23 NOTE — Progress Notes (Signed)
Date:  11/23/2019   HPI: Glenn Sanchez is a 49 y.o. male who presents to the [place] [facility] for 3 month PrEP follow-up.  Insured   [x]    Uninsured  []    There are no problems to display for this patient.   Patient's Medications  New Prescriptions   No medications on file  Previous Medications   EMTRICITABINE-TENOFOVIR AF (DESCOVY) 200-25 MG TABLET    Take 1 tablet by mouth daily.   LEVOFLOXACIN (LEVAQUIN) 500 MG TABLET    Take 1 tablet (500 mg total) by mouth daily.   LISDEXAMFETAMINE (VYVANSE) 70 MG CAPSULE    Take 70 mg by mouth daily.   MULTIPLE VITAMIN (MULTIVITAMIN) TABLET    Take 1 tablet by mouth daily.   OIL OF OREGANO 1500 MG CAPS    Take 1,500 mg by mouth 2 (two) times a week.   PENTOXIFYLLINE (TRENTAL) 400 MG CR TABLET    Take 400 mg by mouth 3 (three) times daily with meals.  Modified Medications   No medications on file  Discontinued Medications   No medications on file    Allergies: Allergies  Allergen Reactions  . Penicillins Hives    As an infant, does not remember reaction specifically Did it involve swelling of the face/tongue/throat, SOB, or low BP? Unknown Did it involve sudden or severe rash/hives, skin peeling, or any reaction on the inside of your mouth or nose? Yes Did you need to seek medical attention at a hospital or doctor's office? Unknown When did it last happen?Childhood rxn If all above answers are "NO", may proceed with cephalosporin use.  Glenn Sanchez Antibiotics Hives    Past Medical History: Past Medical History:  Diagnosis Date  . ADHD     Social History: Social History   Socioeconomic History  . Marital status: Single    Spouse name: Not on file  . Number of children: Not on file  . Years of education: Not on file  . Highest education level: Not on file  Occupational History  . Not on file  Tobacco Use  . Smoking status: Current Every Day Smoker    Types: Cigarettes  . Smokeless tobacco: Never Used  Substance and  Sexual Activity  . Alcohol use: Not on file  . Drug use: Not on file  . Sexual activity: Not on file  Other Topics Concern  . Not on file  Social History Narrative  . Not on file   Social Determinants of Health   Financial Resource Strain:   . Difficulty of Paying Living Expenses:   Food Insecurity:   . Worried About Charity fundraiser in the Last Year:   . Arboriculturist in the Last Year:   Transportation Needs:   . Film/video editor (Medical):   Marland Kitchen Lack of Transportation (Non-Medical):   Physical Activity:   . Days of Exercise per Week:   . Minutes of Exercise per Session:   Stress:   . Feeling of Stress :   Social Connections:   . Frequency of Communication with Friends and Family:   . Frequency of Social Gatherings with Friends and Family:   . Attends Religious Services:   . Active Member of Clubs or Organizations:   . Attends Archivist Meetings:   Marland Kitchen Marital Status:     CHL HIV PREP FLOWSHEET RESULTS 05/25/2019 02/23/2019 11/24/2018 09/02/2018 05/31/2018 03/01/2018  Insurance Status Insured Insured Insured Insured Insured Uninsured  How did you hear? - - - - -  [place] Fam  Gender at birth Male Male Male Male Male Male  Gender identity cis-Male cis-Male cis-Male cis-Male cis-Male cis-Male  Risk for HIV >5 partners in past 6 mos (regardless of condom use) - - >5 partners in past 6 mos (regardless of condom use) Condomless vaginal or anal intercourse Condomless vaginal or anal intercourse  Sex Partners Men only Men only Men only Men only Men only Men only  # sex partners past 3-6 mos 1-3 1-3 1-3 1-3 1-3 1-3  Sex activity preferences Insertive and receptive;Oral - Insertive and receptive;Oral Oral;Insertive and receptive Insertive;Receptive;Oral Insertive and receptive;Oral  Condom use Yes - Yes Yes Yes -  % condom use 100 - - 100 100 -  Treated for STI? No - No No No No  HIV symptoms? N/A N/A N/A N/A N/A N/A  PrEP Eligibility Substantial risk for HIV - Substantial  risk for HIV Substantial risk for HIV HIV negative;CrCl >60 ml/min Substantial risk for HIV    Labs:  SCr: Lab Results  Component Value Date   CREATININE 0.93 08/24/2019   CREATININE 1.13 02/23/2019   CREATININE 1.03 09/02/2018   CREATININE 1.16 03/01/2018   HIV Lab Results  Component Value Date   HIV NON-REACTIVE 08/24/2019   HIV NON-REACTIVE 05/25/2019   HIV NON-REACTIVE 02/23/2019   HIV NON-REACTIVE 11/24/2018   HIV NON-REACTIVE 09/02/2018   Hepatitis B Lab Results  Component Value Date   HEPBSAB REACTIVE (A) 03/01/2018   HEPBSAG NON-REACTIVE 03/01/2018   Hepatitis C No results found for: HEPCAB, HCVRNAPCRQN Hepatitis A No results found for: HAV RPR and STI Lab Results  Component Value Date   LABRPR NON-REACTIVE 05/25/2019   LABRPR NON-REACTIVE 09/02/2018   LABRPR NON-REACTIVE 03/01/2018    STI Results GC CT  05/25/2019 Negative Negative  05/25/2019 Negative Negative  05/25/2019 Negative Negative  09/03/2018 Negative Negative  09/02/2018 Negative Negative  09/02/2018 Negative Negative  03/01/2018 Negative Negative  03/01/2018 Negative Negative  03/01/2018 Negative Negative    Assessment: Glenn Sanchez is here today for his f/u Descovy PrEP visit. Overall he is doing well. He reported having 2 sexual partners with 100% condom use while participating in oral/insertive/receptive sexual activity. He reported not missing any doses.  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   Plan: 1) Labs: HIV Ab, BMP, STI screening 2) 90 days Descovy if HIV negative 3) F/U 3 months  Glenn Sanchez Student Pharmacist, Class of 2021 [facility] 11/23/2019, 2:33 PM

## 2019-11-24 ENCOUNTER — Encounter: Payer: Self-pay | Admitting: Pharmacist

## 2019-11-24 ENCOUNTER — Other Ambulatory Visit: Payer: Self-pay | Admitting: Pharmacist

## 2019-11-24 DIAGNOSIS — Z7252 High risk homosexual behavior: Secondary | ICD-10-CM

## 2019-11-24 LAB — URINE CYTOLOGY ANCILLARY ONLY
Chlamydia: NEGATIVE
Comment: NEGATIVE
Comment: NORMAL
Neisseria Gonorrhea: NEGATIVE

## 2019-11-24 LAB — BASIC METABOLIC PANEL
BUN: 14 mg/dL (ref 7–25)
CO2: 28 mmol/L (ref 20–32)
Calcium: 10 mg/dL (ref 8.6–10.3)
Chloride: 103 mmol/L (ref 98–110)
Creat: 0.89 mg/dL (ref 0.60–1.35)
Glucose, Bld: 70 mg/dL (ref 65–99)
Potassium: 4.4 mmol/L (ref 3.5–5.3)
Sodium: 138 mmol/L (ref 135–146)

## 2019-11-24 LAB — HIV ANTIBODY (ROUTINE TESTING W REFLEX): HIV 1&2 Ab, 4th Generation: NONREACTIVE

## 2019-11-24 MED ORDER — DESCOVY 200-25 MG PO TABS: 1.0000 | ORAL_TABLET | Freq: Every day | ORAL | 2 refills | Status: DC

## 2019-11-24 NOTE — Progress Notes (Signed)
Patient's HIV antibody is negative.  Will send in 3 more months of Descovy to [facility].  

## 2019-11-25 LAB — CYTOLOGY, (ORAL, ANAL, URETHRAL) ANCILLARY ONLY
Chlamydia: NEGATIVE
Chlamydia: NEGATIVE
Comment: NEGATIVE
Comment: NEGATIVE
Comment: NORMAL
Comment: NORMAL
Neisseria Gonorrhea: NEGATIVE
Neisseria Gonorrhea: NEGATIVE

## 2019-12-07 MED FILL — DESCOVY 200-25 MG TABS: 200-25 | 30 days supply | Qty: 30 | Fill #0

## 2020-01-05 MED FILL — DESCOVY 200-25 MG TABS: 200-25 | 30 days supply | Qty: 30 | Fill #1

## 2020-02-22 ENCOUNTER — Ambulatory Visit: Payer: BC Managed Care – PPO | Admitting: Pharmacist

## 2020-03-13 ENCOUNTER — Ambulatory Visit (INDEPENDENT_AMBULATORY_CARE_PROVIDER_SITE_OTHER): Payer: BC Managed Care – PPO | Admitting: Pharmacist

## 2020-03-13 ENCOUNTER — Other Ambulatory Visit: Payer: Self-pay

## 2020-03-13 DIAGNOSIS — Z79899 Other long term (current) drug therapy: Secondary | ICD-10-CM | POA: Diagnosis not present

## 2020-03-13 DIAGNOSIS — Z7252 High risk homosexual behavior: Secondary | ICD-10-CM

## 2020-03-13 MED ORDER — DESCOVY 200-25 MG PO TABS: 1.0000 | ORAL_TABLET | Freq: Every day | ORAL | 2 refills | Status: DC

## 2020-03-13 NOTE — Progress Notes (Addendum)
Date:  03/13/2020   HPI: [name] is a 49 y.o. male who presents to the [facility] pharmacy clinic for 3 month PrEP follow-up.  Insured   [x]    Uninsured  []    There are no problems to display for this patient.   Patient's Medications  New Prescriptions   No medications on file  Previous Medications   EMTRICITABINE-TENOFOVIR AF (DESCOVY) 200-25 MG TABLET    Take 1 tablet by mouth daily.   LEVOFLOXACIN (LEVAQUIN) 500 MG TABLET    Take 1 tablet (500 mg total) by mouth daily.   LISDEXAMFETAMINE (VYVANSE) 70 MG CAPSULE    Take 70 mg by mouth daily.   MULTIPLE VITAMIN (MULTIVITAMIN) TABLET    Take 1 tablet by mouth daily.   OIL OF OREGANO 1500 MG CAPS    Take 1,500 mg by mouth 2 (two) times a week.   PENTOXIFYLLINE (TRENTAL) 400 MG CR TABLET    Take 400 mg by mouth 3 (three) times daily with meals.  Modified Medications   No medications on file  Discontinued Medications   No medications on file    Allergies: Allergies  Allergen Reactions  . Penicillins Hives    As an infant, does not remember reaction specifically Did it involve swelling of the face/tongue/throat, SOB, or low BP? Unknown Did it involve sudden or severe rash/hives, skin peeling, or any reaction on the inside of your mouth or nose? Yes Did you need to seek medical attention at a hospital or doctor's office? Unknown When did it last happen?Childhood rxn If all above answers are "NO", may proceed with cephalosporin use.  Antibiotics Hives    Past Medical History: Past Medical History:  Diagnosis Date  . ADHD     Social History: Social History   Socioeconomic History  . Marital status: Single    Spouse name: Not on file  . Number of children: Not on file  . Years of education: Not on file  . Highest education level: Not on file  Occupational History  . Not on file  Tobacco Use  . Smoking status: Current Every Day Smoker    Types: Cigarettes  . Smokeless tobacco: Never Used  Vaping Use    . Vaping Use: Never used  Substance and Sexual Activity  . Alcohol use: Not on file  . Drug use: Not on file  . Sexual activity: Not on file  Other Topics Concern  . Not on file  Social History Narrative  . Not on file   Social Determinants of Health   Financial Resource Strain:   . Difficulty of Paying Living Expenses:   Food Insecurity:   . Worried About in the Last Year:   . Gaetana Michaelis in the Last Year:   Transportation Needs:   . Programme researcher, broadcasting/film/video (Medical):   Barista Lack of Transportation (Non-Medical):   Physical Activity:   . Days of Exercise per Week:   . Minutes of Exercise per Session:   Stress:   . Feeling of Stress :   Social Connections:   . Frequency of Communication with Friends and Family:   . Frequency of Social Gatherings with Friends and Family:   . Attends Religious Services:   . Active Member of Clubs or Organizations:   . Attends Freight forwarder Meetings:   Marland Kitchen Marital Status:     CHL HIV PREP FLOWSHEET RESULTS 11/23/2019 05/25/2019 02/23/2019 11/24/2018 09/02/2018 05/31/2018 03/01/2018  Insurance Status -  Insured Insured Insured Insured Insured Uninsured  How did you hear? - - - - - - [place] Fam  Gender at birth - Male Male Male Male Male Male  Gender identity - cis-Male cis-Male cis-Male cis-Male cis-Male cis-Male  Risk for HIV - >5 partners in past 6 mos (regardless of condom use) - - >5 partners in past 6 mos (regardless of condom use) Condomless vaginal or anal intercourse Condomless vaginal or anal intercourse  Sex Partners - Men only Men only Men only Men only Men only Men only  # sex partners past 3-6 mos 1-3 1-3 1-3 1-3 1-3 1-3 1-3  Sex activity preferences Insertive and receptive;Oral Insertive and receptive;Oral - Insertive and receptive;Oral Oral;Insertive and receptive Insertive;Receptive;Oral Insertive and receptive;Oral  Condom use Yes Yes - Yes Yes Yes -  % condom use - 100 - - 100 100 -  Treated for STI? - No - No No  No No  HIV symptoms? N/A N/A N/A N/A N/A N/A N/A  PrEP Eligibility - Substantial risk for HIV - Substantial risk for HIV Substantial risk for HIV HIV negative;CrCl >60 ml/min Substantial risk for HIV    Labs:  SCr: Lab Results  Component Value Date   CREATININE 0.89 11/23/2019   CREATININE 0.93 08/24/2019   CREATININE 1.13 02/23/2019   CREATININE 1.03 09/02/2018   CREATININE 1.16 03/01/2018   HIV Lab Results  Component Value Date   HIV NON-REACTIVE 11/23/2019   HIV NON-REACTIVE 08/24/2019   HIV NON-REACTIVE 05/25/2019   HIV NON-REACTIVE 02/23/2019   HIV NON-REACTIVE 11/24/2018   Hepatitis B Lab Results  Component Value Date   HEPBSAB REACTIVE (A) 03/01/2018   HEPBSAG NON-REACTIVE 03/01/2018   Hepatitis C No results found for: HEPCAB, HCVRNAPCRQN Hepatitis A No results found for: HAV RPR and STI Lab Results  Component Value Date   LABRPR NON-REACTIVE 05/25/2019   LABRPR NON-REACTIVE 09/02/2018   LABRPR NON-REACTIVE 03/01/2018    STI Results GC CT  11/23/2019 Negative Negative  11/23/2019 Negative Negative  11/23/2019 Negative Negative  05/25/2019 Negative Negative  05/25/2019 Negative Negative  05/25/2019 Negative Negative  09/03/2018 Negative Negative  09/02/2018 Negative Negative  09/02/2018 Negative Negative  03/01/2018 Negative Negative  03/01/2018 Negative Negative  03/01/2018 Negative Negative    Assessment: Glenn Sanchez is here today to follow up for PrEP. He is doing well on Descovy and not having any issues. 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 No issues today or concerns.  Will check labs and see him back in 3 months.  Of note, he does not know when his insurance will stop as he had his school insurance before he graduated.  I told him that I would coordinate with the pharmacy and get him back on patient assistance if needed.  Plan: - HIV antibody today - Descovy x  3 months if HIV negative - F/u in 3 months  Olamide Lahaie L. Star Resler, PharmD, BCIDP, AAHIVP, CPP Clinical Pharmacist Practitioner Infectious Diseases Clinical Pharmacist [facility] 03/13/2020, 10:54 AM

## 2020-03-14 ENCOUNTER — Encounter: Payer: Self-pay | Admitting: Pharmacist

## 2020-03-14 LAB — HIV ANTIBODY (ROUTINE TESTING W REFLEX): HIV 1&2 Ab, 4th Generation: NONREACTIVE

## 2020-03-14 MED FILL — DESCOVY 200-25 MG TABS: 200-25 | 30 days supply | Qty: 30 | Fill #0

## 2020-04-16 ENCOUNTER — Telehealth: Payer: Self-pay | Admitting: Pharmacy Technician

## 2020-04-16 MED FILL — DESCOVY 200-25 MG TABS: 200-25 | 30 days supply | Qty: 30 | Fill #1

## 2020-04-16 NOTE — Telephone Encounter (Addendum)
[facility] Patient Advocate Encounter  Completed and sent [employer] Advancing Access application for Descovy for this patient who is uninsured.    Patient is approved 04/16/2020 through 04/16/2021.    Netty Starring. Dimas Aguas CPhT [facility] Patient [facility] Phone: [phone] Fax:  [phone]

## 2020-05-11 MED FILL — DESCOVY 200-25 MG TABS: 200-25 | 30 days supply | Qty: 30 | Fill #2

## 2020-06-12 ENCOUNTER — Ambulatory Visit (INDEPENDENT_AMBULATORY_CARE_PROVIDER_SITE_OTHER): Payer: Self-pay | Admitting: Pharmacist

## 2020-06-12 ENCOUNTER — Other Ambulatory Visit: Payer: Self-pay

## 2020-06-12 DIAGNOSIS — Z79899 Other long term (current) drug therapy: Secondary | ICD-10-CM

## 2020-06-12 DIAGNOSIS — Z7252 High risk homosexual behavior: Secondary | ICD-10-CM

## 2020-06-12 DIAGNOSIS — Z23 Encounter for immunization: Secondary | ICD-10-CM

## 2020-06-12 NOTE — Progress Notes (Signed)
Date:  06/12/2020   HPI: Glenn Sanchez is a 49 y.o. male who presents to the [facility] pharmacy clinic for HIV PrEP follow-up.  Insured   []    Uninsured  [x]    There are no problems to display for this patient.   Patient's Medications  New Prescriptions   No medications on file  Previous Medications   EMTRICITABINE-TENOFOVIR AF (DESCOVY) 200-25 MG TABLET    Take 1 tablet by mouth daily.   LEVOFLOXACIN (LEVAQUIN) 500 MG TABLET    Take 1 tablet (500 mg total) by mouth daily.   LISDEXAMFETAMINE (VYVANSE) 70 MG CAPSULE    Take 70 mg by mouth daily.   MULTIPLE VITAMIN (MULTIVITAMIN) TABLET    Take 1 tablet by mouth daily.   OIL OF OREGANO 1500 MG CAPS    Take 1,500 mg by mouth 2 (two) times a week.   PENTOXIFYLLINE (TRENTAL) 400 MG CR TABLET    Take 400 mg by mouth 3 (three) times daily with meals.  Modified Medications   No medications on file  Discontinued Medications   No medications on file    Allergies: Allergies  Allergen Reactions  . Penicillins Hives    As an infant, does not remember reaction specifically Did it involve swelling of the face/tongue/throat, SOB, or low BP? Unknown Did it involve sudden or severe rash/hives, skin peeling, or any reaction on the inside of your mouth or nose? Yes Did you need to seek medical attention at a hospital or doctor's office? Unknown When did it last happen?Childhood rxn If all above answers are "NO", may proceed with cephalosporin use.  Antibiotics Hives    Past Medical History: Past Medical History:  Diagnosis Date  . ADHD     Social History: Social History   Socioeconomic History  . Marital status: Single    Spouse name: Not on file  . Number of children: Not on file  . Years of education: Not on file  . Highest education level: Not on file  Occupational History  . Not on file  Tobacco Use  . Smoking status: Current Every Day Smoker    Types: Cigarettes  . Smokeless tobacco: Never Used  Vaping Use  .  Vaping Use: Never used  Substance and Sexual Activity  . Alcohol use: Not on file  . Drug use: Not on file  . Sexual activity: Not on file  Other Topics Concern  . Not on file  Social History Narrative  . Not on file   Social Determinants of Health   Financial Resource Strain:   . Difficulty of Paying Living Expenses: Not on file  Food Insecurity:   . Worried About in the Last Year: Not on file  . Ran Out of Food in the Last Year: Not on file  Transportation Needs:   . Lack of Transportation (Medical): Not on file  . Lack of Transportation (Non-Medical): Not on file  Physical Activity:   . Days of Exercise per Week: Not on file  . Minutes of Exercise per Session: Not on file  Stress:   . Feeling of Stress : Not on file  Social Connections:   . Frequency of Communication with Friends and Family: Not on file  . Frequency of Social Gatherings with Friends and Family: Not on file  . Attends Religious Services: Not on file  . Active Member of Clubs or Organizations: Not on file  . Attends Gaetana Michaelis Meetings: Not on file  .  Marital Status: Not on file    [facility] HIV PREP FLOWSHEET RESULTS 11/23/2019 05/25/2019 02/23/2019 11/24/2018 09/02/2018 05/31/2018 03/01/2018  Insurance Status - Insured Insured Insured Insured Insured Uninsured  How did you hear? - - - - - - Manuel  Gender at birth - Male Male Male Male Male Male  Gender identity - cis-Male cis-Male cis-Male cis-Male cis-Male cis-Male  Risk for HIV - >5 partners in past 6 mos (regardless of condom use) - - >5 partners in past 6 mos (regardless of condom use) Condomless vaginal or anal intercourse Condomless vaginal or anal intercourse  Sex Partners - Men only Men only Men only Men only Men only Men only  # sex partners past 3-6 mos 1-3 1-3 1-3 1-3 1-3 1-3 1-3  Sex activity preferences Insertive and receptive;Oral Insertive and receptive;Oral - Insertive and receptive;Oral Oral;Insertive and receptive  Insertive;Receptive;Oral Insertive and receptive;Oral  Condom use Yes Yes - Yes Yes Yes -  % condom use - 100 - - 100 100 -  Treated for STI? - No - No No No No  HIV symptoms? N/A N/A N/A N/A N/A N/A N/A  PrEP Eligibility - Substantial risk for HIV - Substantial risk for HIV Substantial risk for HIV HIV negative;CrCl >60 ml/min Substantial risk for HIV    Labs:  SCr: Lab Results  Component Value Date   CREATININE 0.89 11/23/2019   CREATININE 0.93 08/24/2019   CREATININE 1.13 02/23/2019   CREATININE 1.03 09/02/2018   CREATININE 1.16 03/01/2018   HIV Lab Results  Component Value Date   HIV NON-REACTIVE 03/13/2020   HIV NON-REACTIVE 11/23/2019   HIV NON-REACTIVE 08/24/2019   HIV NON-REACTIVE 05/25/2019   HIV NON-REACTIVE 02/23/2019   Hepatitis B Lab Results  Component Value Date   HEPBSAB REACTIVE (A) 03/01/2018   HEPBSAG NON-REACTIVE 03/01/2018   Hepatitis C No results found for: HEPCAB, HCVRNAPCRQN Hepatitis A No results found for: HAV RPR and STI Lab Results  Component Value Date   LABRPR NON-REACTIVE 05/25/2019   LABRPR NON-REACTIVE 09/02/2018   LABRPR NON-REACTIVE 03/01/2018    STI Results GC CT  11/23/2019 Negative Negative  11/23/2019 Negative Negative  11/23/2019 Negative Negative  05/25/2019 Negative Negative  05/25/2019 Negative Negative  05/25/2019 Negative Negative  09/03/2018 Negative Negative  09/02/2018 Negative Negative  09/02/2018 Negative Negative  03/01/2018 Negative Negative  03/01/2018 Negative Negative  03/01/2018 Negative Negative    Assessment: Glenn Sanchez is here today to follow up for PrEP. He takes his Descovy daily and has no issues with it.  He is currently uninsured. No new parntesr since his last visit. Will update labs and see him back in January.   I gave him his flu shot today and emailed [facility]  to let them know.  Plan: - HIV antibody + BMET today - Descovy x 3 months if HIV negative - F/u in 3 months  Airen Dales L. Tonie Vizcarrondo, PharmD, BCIDP,  AAHIVP, CPP Clinical Pharmacist Practitioner Infectious Diseases Clinical Pharmacist [facility] 06/12/2020, 9:15 AM

## 2020-06-13 ENCOUNTER — Other Ambulatory Visit: Payer: Self-pay | Admitting: Pharmacist

## 2020-06-13 DIAGNOSIS — Z7252 High risk homosexual behavior: Secondary | ICD-10-CM

## 2020-06-13 LAB — BASIC METABOLIC PANEL
BUN: 13 mg/dL (ref 7–25)
CO2: 25 mmol/L (ref 20–32)
Calcium: 10.1 mg/dL (ref 8.6–10.3)
Chloride: 101 mmol/L (ref 98–110)
Creat: 0.84 mg/dL (ref 0.60–1.35)
Glucose, Bld: 93 mg/dL (ref 65–99)
Potassium: 4.7 mmol/L (ref 3.5–5.3)
Sodium: 138 mmol/L (ref 135–146)

## 2020-06-13 LAB — HIV ANTIBODY (ROUTINE TESTING W REFLEX): HIV 1&2 Ab, 4th Generation: NONREACTIVE

## 2020-06-13 MED ORDER — DESCOVY 200-25 MG PO TABS: 1.0000 | ORAL_TABLET | Freq: Every day | ORAL | 2 refills | Status: DC

## 2020-06-13 MED FILL — DESCOVY 200-25 MG TABS: 200-25 | 30 days supply | Qty: 30 | Fill #0

## 2020-06-13 NOTE — Progress Notes (Signed)
PrEP refill

## 2020-07-11 MED FILL — DESCOVY 200-25 MG TABS: 200-25 | 30 days supply | Qty: 30 | Fill #1

## 2020-08-10 MED FILL — DESCOVY 200-25 MG TABS: 200-25 | 30 days supply | Qty: 30 | Fill #2

## 2020-08-23 ENCOUNTER — Ambulatory Visit: Payer: Self-pay | Attending: Internal Medicine

## 2020-08-23 DIAGNOSIS — Z23 Encounter for immunization: Secondary | ICD-10-CM

## 2020-08-23 NOTE — Progress Notes (Signed)
° °  Covid-19 Vaccination Clinic  Name:  Glenn Sanchez    MRN: [id number] DOB: 1971-07-26  08/23/2020  [name] was observed post Covid-19 immunization for 15 minutes without incident. He was provided with Vaccine Information Sheet and instruction to access the V-Safe system.   [name] was instructed to call 911 with any severe reactions post vaccine:  Difficulty breathing   Swelling of face and throat   A fast heartbeat   A bad rash all over body   Dizziness and weakness   Immunizations Administered    Name Date Dose VIS Date Route   Pfizer COVID-19 Vaccine 08/23/2020  2:52 PM 0.3 mL 06/20/2020 Intramuscular   Manufacturer: [employer], [employer]   Lot: [id number]   NDC: [address]-2

## 2020-09-10 ENCOUNTER — Ambulatory Visit: Payer: Self-pay | Admitting: Pharmacist

## 2020-09-12 ENCOUNTER — Other Ambulatory Visit: Payer: Self-pay

## 2020-09-12 ENCOUNTER — Ambulatory Visit: Payer: Self-pay | Admitting: Pharmacist

## 2020-09-12 DIAGNOSIS — Z79899 Other long term (current) drug therapy: Secondary | ICD-10-CM

## 2020-09-12 NOTE — Progress Notes (Signed)
Date:  09/12/2020   HPI: Glenn Sanchez is a 50 y.o. male who presents to the [facility] pharmacy clinic for HIV PrEP follow-up.  Insured   []    Uninsured  [x]    There are no problems to display for this patient.   Patient's Medications  New Prescriptions   No medications on file  Previous Medications   EMTRICITABINE-TENOFOVIR AF (DESCOVY) 200-25 MG TABLET    Take 1 tablet by mouth daily.   LEVOFLOXACIN (LEVAQUIN) 500 MG TABLET    Take 1 tablet (500 mg total) by mouth daily.   LISDEXAMFETAMINE (VYVANSE) 70 MG CAPSULE    Take 70 mg by mouth daily.   MULTIPLE VITAMIN (MULTIVITAMIN) TABLET    Take 1 tablet by mouth daily.   OIL OF OREGANO 1500 MG CAPS    Take 1,500 mg by mouth 2 (two) times a week.   PENTOXIFYLLINE (TRENTAL) 400 MG CR TABLET    Take 400 mg by mouth 3 (three) times daily with meals.  Modified Medications   No medications on file  Discontinued Medications   No medications on file    Allergies: Allergies  Allergen Reactions  . Penicillins Hives    As an infant, does not remember reaction specifically Did it involve swelling of the face/tongue/throat, SOB, or low BP? Unknown Did it involve sudden or severe rash/hives, skin peeling, or any reaction on the inside of your mouth or nose? Yes Did you need to seek medical attention at a hospital or doctor's office? Unknown When did it last happen?Childhood rxn If all above answers are "NO", may proceed with cephalosporin use.  Antibiotics Hives    Past Medical History: Past Medical History:  Diagnosis Date  . ADHD     Social History: Social History   Socioeconomic History  . Marital status: Single    Spouse name: Not on file  . Number of children: Not on file  . Years of education: Not on file  . Highest education level: Not on file  Occupational History  . Not on file  Tobacco Use  . Smoking status: Current Every Day Smoker    Types: Cigarettes  . Smokeless tobacco: Never Used  Vaping Use  .  Vaping Use: Never used  Substance and Sexual Activity  . Alcohol use: Not on file  . Drug use: Not on file  . Sexual activity: Not on file  Other Topics Concern  . Not on file  Social History Narrative  . Not on file   Social Determinants of Health   Financial Resource Strain: Not on file  Food Insecurity: Not on file  Transportation Needs: Not on file  Physical Activity: Not on file  Stress: Not on file  Social Connections: Not on file    [facility] HIV PREP FLOWSHEET RESULTS 11/23/2019 05/25/2019 02/23/2019 11/24/2018 09/02/2018 05/31/2018 03/01/2018  Insurance Status - Insured Insured Insured Insured Insured Uninsured  How did you hear? - - - - - - 06/02/2018  Gender at birth - Male Male Male Male Male Male  Gender identity - cis-Male cis-Male cis-Male cis-Male cis-Male cis-Male  Risk for HIV - >5 partners in past 6 mos (regardless of condom use) - - >5 partners in past 6 mos (regardless of condom use) Condomless vaginal or anal intercourse Condomless vaginal or anal intercourse  Sex Partners - Men only Men only Men only Men only Men only Men only  # sex partners past 3-6 mos 1-3 1-3 1-3 1-3 1-3 1-3 1-3  Sex  activity preferences Insertive and receptive;Oral Insertive and receptive;Oral - Insertive and receptive;Oral Oral;Insertive and receptive Insertive;Receptive;Oral Insertive and receptive;Oral  Condom use Yes Yes - Yes Yes Yes -  % condom use - 100 - - 100 100 -  Treated for STI? - No - No No No No  HIV symptoms? N/A N/A N/A N/A N/A N/A N/A  PrEP Eligibility - Substantial risk for HIV - Substantial risk for HIV Substantial risk for HIV HIV negative;CrCl >60 ml/min Substantial risk for HIV    Labs:  SCr: Lab Results  Component Value Date   CREATININE 0.84 06/12/2020   CREATININE 0.89 11/23/2019   CREATININE 0.93 08/24/2019   CREATININE 1.13 02/23/2019   CREATININE 1.03 09/02/2018   HIV Lab Results  Component Value Date   HIV NON-REACTIVE 06/12/2020   HIV NON-REACTIVE 03/13/2020    HIV NON-REACTIVE 11/23/2019   HIV NON-REACTIVE 08/24/2019   HIV NON-REACTIVE 05/25/2019   Hepatitis B Lab Results  Component Value Date   HEPBSAB REACTIVE (A) 03/01/2018   HEPBSAG NON-REACTIVE 03/01/2018   Hepatitis C No results found for: HEPCAB, HCVRNAPCRQN Hepatitis A No results found for: HAV RPR and STI Lab Results  Component Value Date   LABRPR NON-REACTIVE 05/25/2019   LABRPR NON-REACTIVE 09/02/2018   LABRPR NON-REACTIVE 03/01/2018    STI Results GC CT  11/23/2019 Negative Negative  11/23/2019 Negative Negative  11/23/2019 Negative Negative  05/25/2019 Negative Negative  05/25/2019 Negative Negative  05/25/2019 Negative Negative  09/03/2018 Negative Negative  09/02/2018 Negative Negative  09/02/2018 Negative Negative  03/01/2018 Negative Negative  03/01/2018 Negative Negative  03/01/2018 Negative Negative    Assessment: Glenn Sanchez is here today to follow up for PrEP. He is doing well on Descovy and never has any issues or concerns. No problems getting or taking it. He is currently uninsured and receiving his Descovy through [place] assistance at [employer]. No new partners since I saw him last so will just check HIV today. Will bring him back in 3 months to do labs and have him see me in July.  Plan: - HIV antibody today - Descovy x 6 months if HIV negative - F/u for labs 4/13 at 10am - F/u with me 7/11 at 10am  Hjalmer Iovino L. Inella Kuwahara, PharmD, BCIDP, AAHIVP, CPP Clinical Pharmacist Practitioner Infectious Diseases Clinical Pharmacist [facility] 09/12/2020, 10:17 AM

## 2020-09-13 ENCOUNTER — Other Ambulatory Visit: Payer: Self-pay | Admitting: Pharmacist

## 2020-09-13 DIAGNOSIS — Z7252 High risk homosexual behavior: Secondary | ICD-10-CM

## 2020-09-13 LAB — HIV ANTIBODY (ROUTINE TESTING W REFLEX): HIV 1&2 Ab, 4th Generation: NONREACTIVE

## 2020-09-13 MED ORDER — DESCOVY 200-25 MG PO TABS: 1.0000 | ORAL_TABLET | Freq: Every day | ORAL | 5 refills | Status: DC

## 2020-09-28 IMAGING — US ULTRASOUND SCROTUM DOPPLER COMPLETE
1 series · 13 of 25 positions shown · non-contrast
Comparison: None.

CLINICAL DATA: Left-sided scrotal region pain and swelling

EXAM:
SCROTAL ULTRASOUND
DOPPLER ULTRASOUND OF THE TESTICLES
TECHNIQUE: Complete ultrasound examination of the testicles, epididymis, and
other scrotal structures was performed. Color and spectral Doppler
ultrasound were also utilized to evaluate blood flow to the
testicles.

[Series 1: ultrasound scrotum doppler complete · 70 acquisitions, 13 frames shown]
[im 1/70]
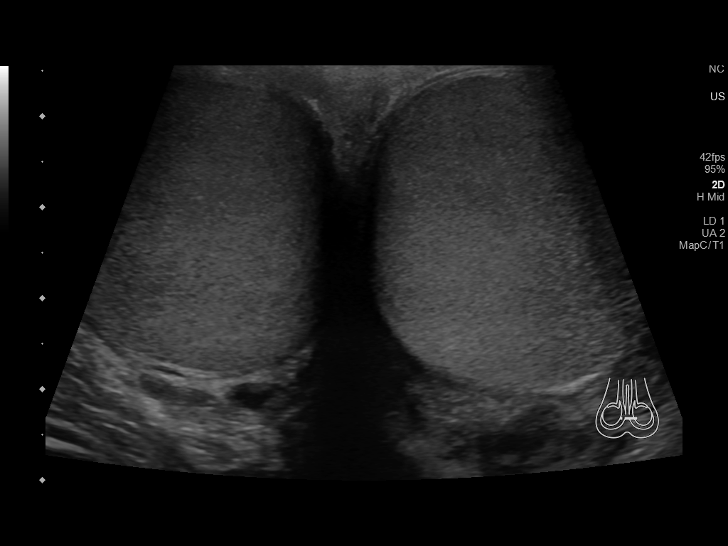
[im 6/70]
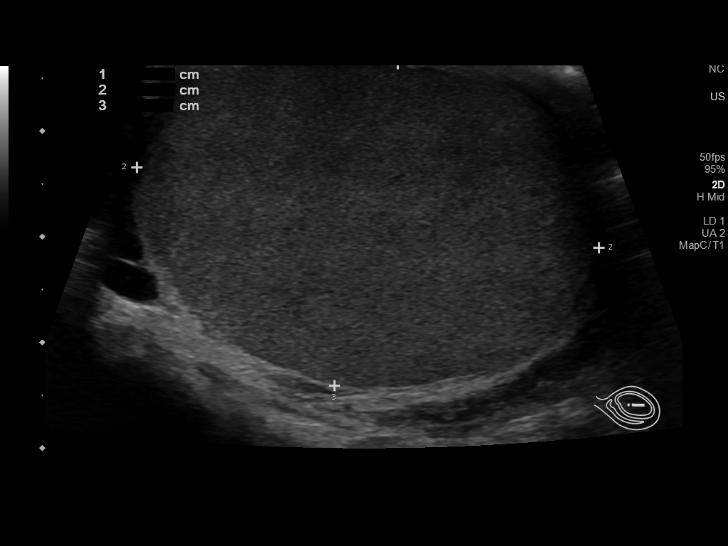
[im 12/70]
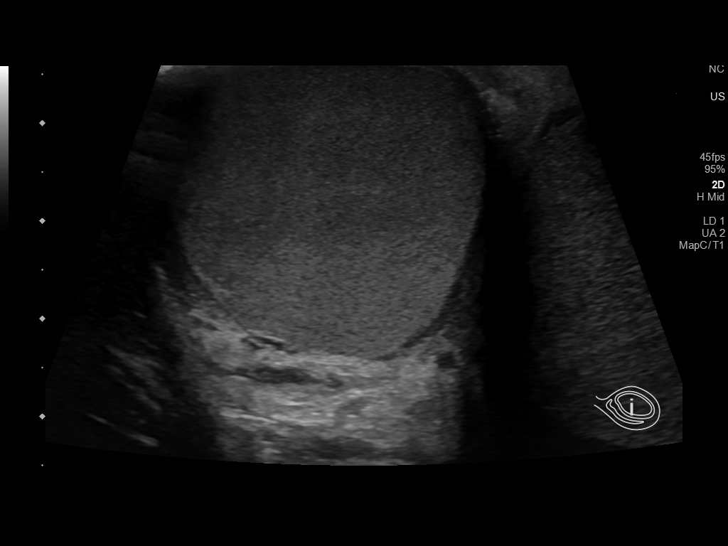
[im 18/70]
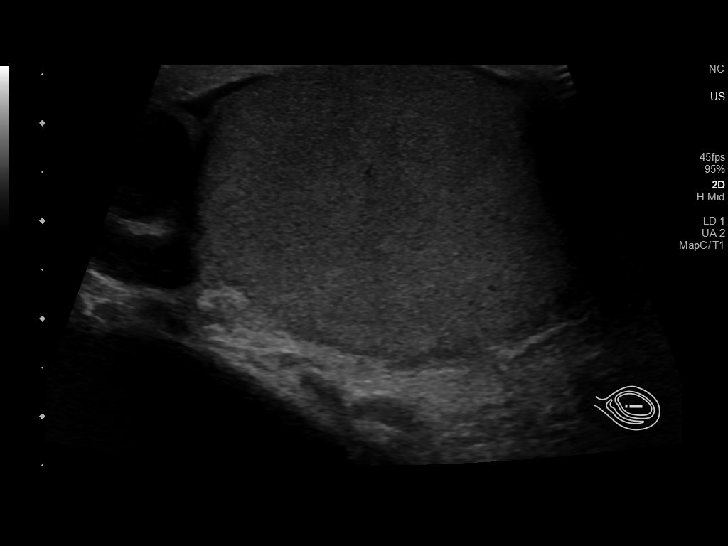
[im 24/70]
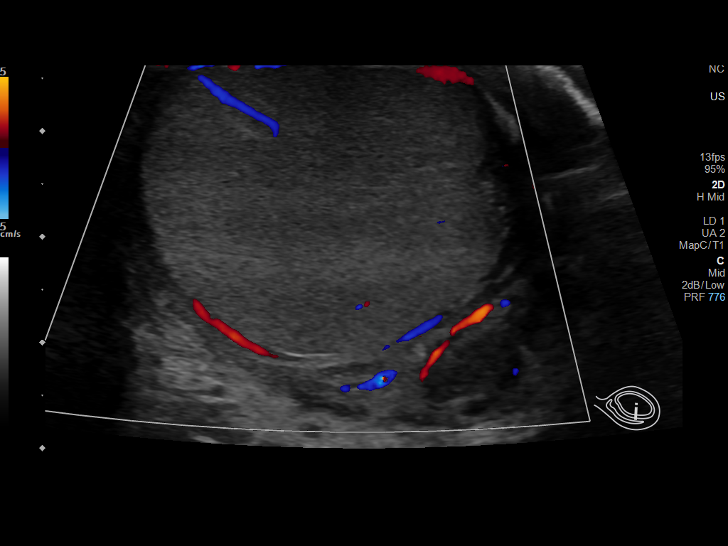
[im 29/70]
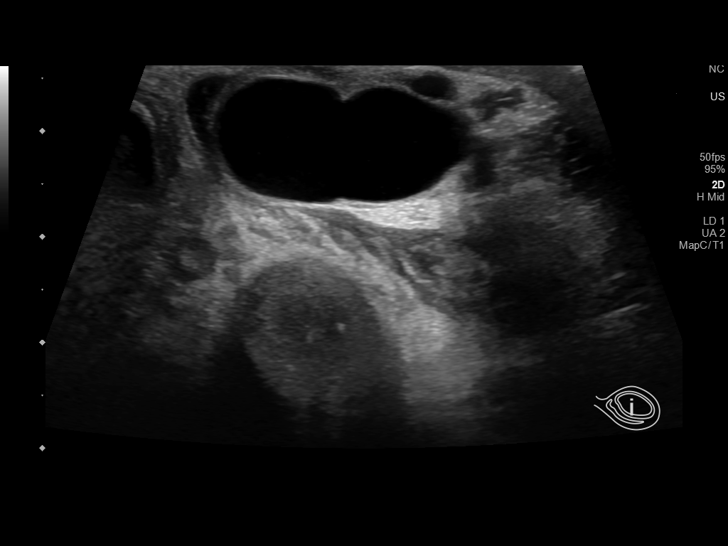
[im 35/70]
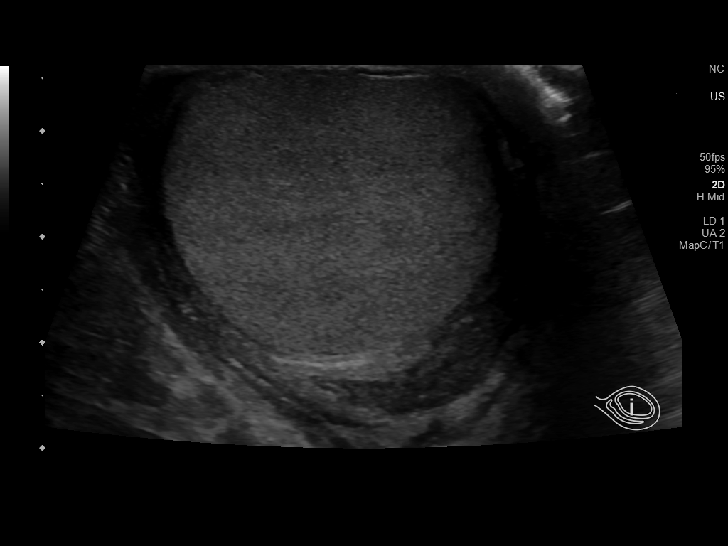
[im 41/70]
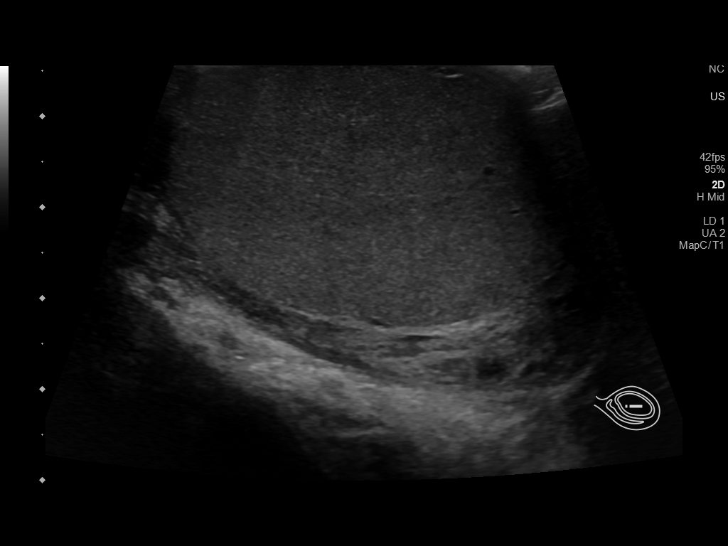
[im 47/70]
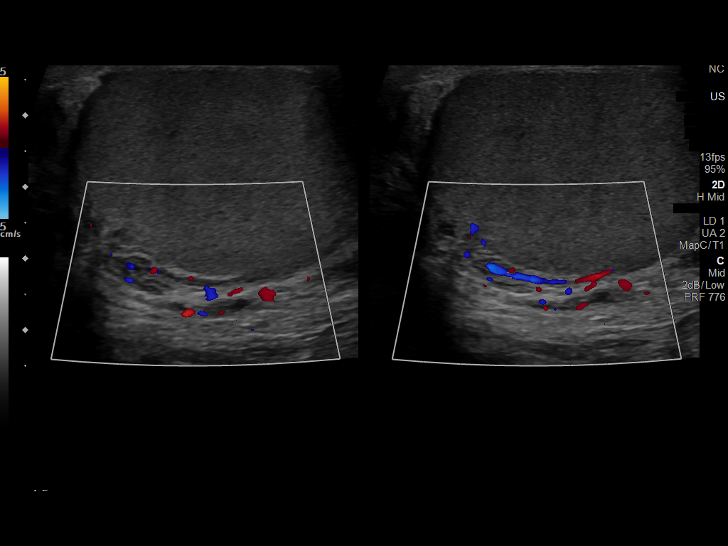
[im 52/70]
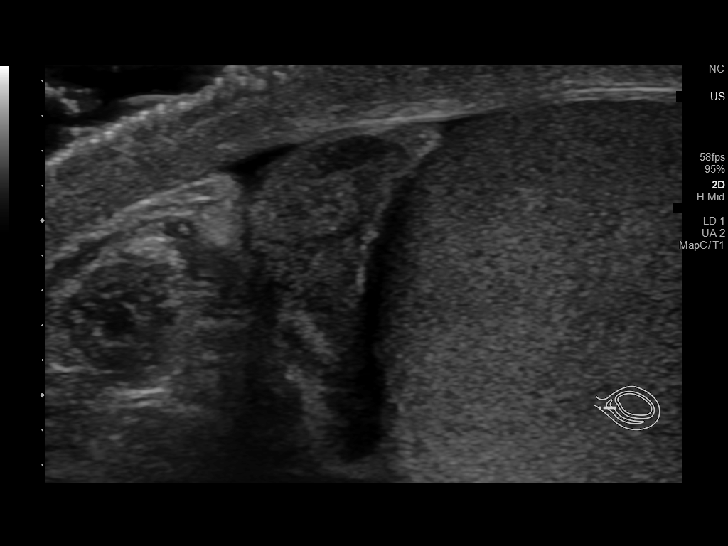
[im 58/70]
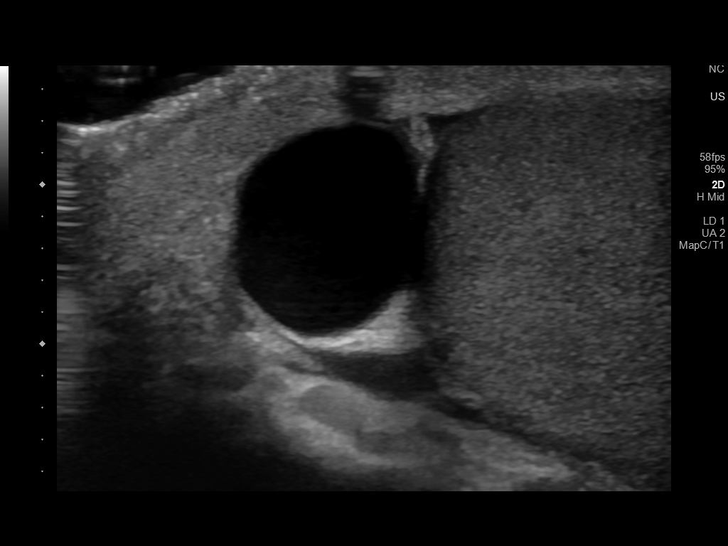
[im 64/70]
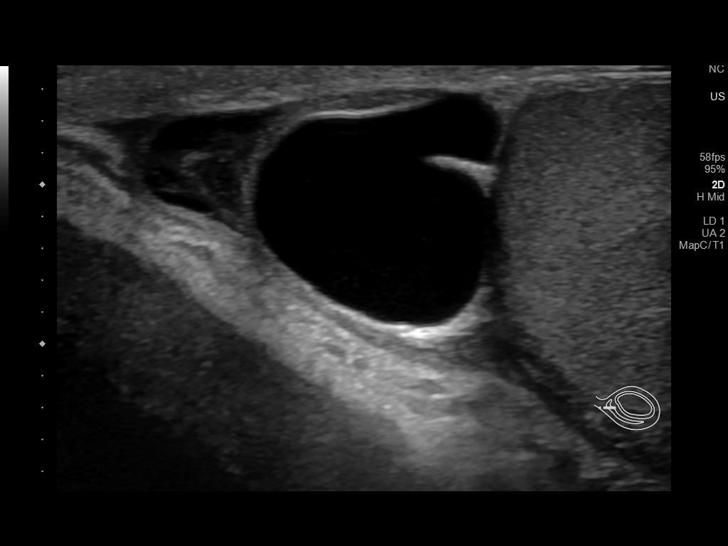
[im 70/70]
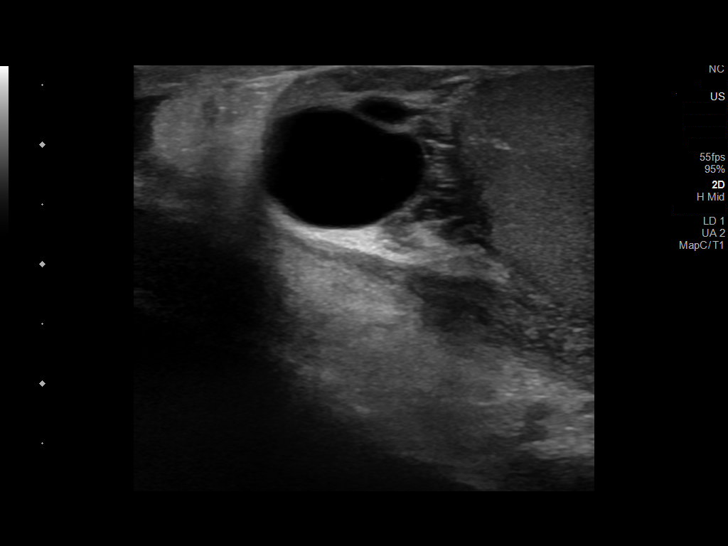

[13 of 25 positions shown; findings below may reference images not displayed]

FINDINGS: Right testicle

Measurements: 4.4 x 3.1 x 3.2 cm. No mass or microlithiasis
visualized.

Left testicle

Measurements: 4.5 x 2.7 x 3.4 cm. No mass or microlithiasis
visualized.

Right epididymis: There is a cyst arising from the head of the
epididymis on the right measuring 1.2 x 1.4 x 1.7 cm. No other mass
in the right epididymal region. No inflammatory focus appreciable on
the right.

Left epididymis: Several cysts arise from the epididymis on the
left. Largest of these cysts measures 1.5 x 1.5 x 2.1 cm. Other
cysts are subcentimeter in size. The largest of the subcentimeter
cysts measures 8 x 7 mm. No noncystic masses evident. No
inflammatory focus.

Hydrocele: There is a small hydrocele on each side, slightly larger
on the right than on the left.

Varicocele:  None visualized.

Pulsed Doppler interrogation of both testes demonstrates normal low
resistance arterial and venous waveforms bilaterally.

No scrotal wall thickening or abscess.
IMPRESSION: 1. Epididymal cysts bilaterally, larger and more numerous on the
left than on the right. No inflammatory focus in either epididymis.

2. No intratesticular mass or inflammation. No evident testicular
torsion on either side.

3. Small bilateral hydroceles, slightly larger on the right than on
the left.

## 2020-10-12 MED FILL — DESCOVY 200-25 MG TABS: 200-25 | 30 days supply | Qty: 30 | Fill #0

## 2020-11-07 MED FILL — DESCOVY 200-25 MG TABS: 200-25 | 30 days supply | Qty: 30 | Fill #1

## 2020-11-23 ENCOUNTER — Other Ambulatory Visit (HOSPITAL_COMMUNITY): Payer: Self-pay

## 2020-12-04 ENCOUNTER — Other Ambulatory Visit (HOSPITAL_COMMUNITY): Payer: Self-pay

## 2020-12-04 MED FILL — Emtricitabine-Tenofovir Alafenamide Fumarate Tab 200-25 MG: ORAL | 30 days supply | Qty: 30 | Fill #0 | Status: AC

## 2020-12-07 ENCOUNTER — Other Ambulatory Visit (HOSPITAL_COMMUNITY): Payer: Self-pay

## 2020-12-10 ENCOUNTER — Other Ambulatory Visit (HOSPITAL_COMMUNITY): Payer: Self-pay

## 2020-12-12 ENCOUNTER — Other Ambulatory Visit: Payer: Self-pay

## 2021-01-04 ENCOUNTER — Other Ambulatory Visit (HOSPITAL_COMMUNITY): Payer: Self-pay

## 2021-01-04 MED FILL — Emtricitabine-Tenofovir Alafenamide Fumarate Tab 200-25 MG: ORAL | 30 days supply | Qty: 30 | Fill #1 | Status: AC

## 2021-01-07 ENCOUNTER — Other Ambulatory Visit (HOSPITAL_COMMUNITY): Payer: Self-pay

## 2021-02-04 ENCOUNTER — Other Ambulatory Visit (HOSPITAL_COMMUNITY): Payer: Self-pay

## 2021-02-04 MED FILL — Emtricitabine-Tenofovir Alafenamide Fumarate Tab 200-25 MG: ORAL | 30 days supply | Qty: 30 | Fill #2 | Status: CN

## 2021-02-07 ENCOUNTER — Other Ambulatory Visit (HOSPITAL_COMMUNITY): Payer: Self-pay

## 2021-02-07 MED FILL — Emtricitabine-Tenofovir Alafenamide Fumarate Tab 200-25 MG: ORAL | 30 days supply | Qty: 30 | Fill #2 | Status: AC

## 2021-03-08 ENCOUNTER — Other Ambulatory Visit (HOSPITAL_COMMUNITY): Payer: Self-pay

## 2021-03-11 ENCOUNTER — Ambulatory Visit: Payer: Self-pay | Admitting: Pharmacist

## 2021-03-15 ENCOUNTER — Other Ambulatory Visit (HOSPITAL_COMMUNITY): Payer: Self-pay

## 2021-03-15 MED FILL — Emtricitabine-Tenofovir Alafenamide Fumarate Tab 200-25 MG: ORAL | 30 days supply | Qty: 30 | Fill #3 | Status: AC

## 2021-04-08 ENCOUNTER — Other Ambulatory Visit (HOSPITAL_COMMUNITY): Payer: Self-pay

## 2021-04-10 ENCOUNTER — Other Ambulatory Visit: Payer: Self-pay | Admitting: Internal Medicine

## 2021-04-10 ENCOUNTER — Other Ambulatory Visit (HOSPITAL_COMMUNITY): Payer: Self-pay

## 2021-04-10 NOTE — Telephone Encounter (Signed)
Thank you :)

## 2021-04-10 NOTE — Telephone Encounter (Signed)
Request for refill. Patient has not been seen since January 22. Need labs and Pharmacy visit. Refill request for Descovy denied.  Glenn Sanchez

## 2021-04-26 ENCOUNTER — Other Ambulatory Visit (HOSPITAL_COMMUNITY): Payer: Self-pay

## 2021-04-26 ENCOUNTER — Telehealth: Payer: Self-pay | Admitting: Pharmacist

## 2021-04-26 DIAGNOSIS — Z7252 High risk homosexual behavior: Secondary | ICD-10-CM

## 2021-04-26 DIAGNOSIS — Z79899 Other long term (current) drug therapy: Secondary | ICD-10-CM

## 2021-04-26 MED ORDER — DESCOVY 200-25 MG PO TABS
1.0000 | ORAL_TABLET | Freq: Every day | ORAL | 0 refills | Status: AC
  Filled 2021-04-26 – 2021-04-30 (×2): qty 30, 30d supply, fill #0

## 2021-04-26 NOTE — Telephone Encounter (Signed)
Spoke with Devean today about setting up PrEP follow-up visits and discussing refills. Cassie last saw him in January. He states he has moved to [place], [place]. His address has been updated in Epic.   He states he has not been sexually active recently, so he has not been taking his PrEP everyday. Will send in 1 month of Descovy from [facility] to bridge him to care. He states if he could not find a clinic or PCP to prescribe PrEP, he would call us back to schedule an appointment here.

## 2021-04-29 ENCOUNTER — Other Ambulatory Visit (HOSPITAL_COMMUNITY): Payer: Self-pay

## 2021-04-30 ENCOUNTER — Telehealth: Payer: Self-pay

## 2021-04-30 ENCOUNTER — Other Ambulatory Visit (HOSPITAL_COMMUNITY): Payer: Self-pay

## 2021-04-30 NOTE — Telephone Encounter (Signed)
[facility] Patient Advocate Encounter  Completed and sent [employer] Advancing Access application for Descovy for this patient who is uninsured.    Patient is approved 04/30/21 through 04/30/22.  BIN      [id number] PCN    [id number] GRP    [id number] ID        [id number]   Clearance Coots, CPhT Specialty Pharmacy Patient [facility] Phone: [phone] Fax:  [phone]

## 2021-11-01 ENCOUNTER — Other Ambulatory Visit (HOSPITAL_COMMUNITY): Payer: Self-pay
